# Patient Record
Sex: Male | Born: 1990 | Race: White | Hispanic: No | Marital: Single | Smoking: Never smoker
Health system: Southern US, Community
[De-identification: ages and names within clinical notes are randomized; demographics above are authoritative.]

## PROBLEM LIST (undated history)

## (undated) DIAGNOSIS — F840 Autistic disorder: Secondary | ICD-10-CM

## (undated) DIAGNOSIS — R569 Unspecified convulsions: Secondary | ICD-10-CM

## (undated) HISTORY — DX: Unspecified convulsions: R56.9

## (undated) HISTORY — DX: Autistic disorder: F84.0

---
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
## 2012-01-12 ENCOUNTER — Institutional Professional Consult (permissible substitution): Payer: BC Managed Care – PPO | Admitting: Pediatrics

## 2012-01-28 ENCOUNTER — Institutional Professional Consult (permissible substitution): Payer: BC Managed Care – PPO | Admitting: Pediatrics

## 2012-02-04 ENCOUNTER — Institutional Professional Consult (permissible substitution): Payer: BC Managed Care – PPO | Admitting: Pediatrics

## 2012-02-04 DIAGNOSIS — R279 Unspecified lack of coordination: Secondary | ICD-10-CM

## 2012-02-04 DIAGNOSIS — F909 Attention-deficit hyperactivity disorder, unspecified type: Secondary | ICD-10-CM

## 2012-02-08 ENCOUNTER — Ambulatory Visit: Payer: BC Managed Care – PPO | Attending: Pediatrics | Admitting: Physical Therapy

## 2012-02-08 DIAGNOSIS — IMO0001 Reserved for inherently not codable concepts without codable children: Secondary | ICD-10-CM | POA: Insufficient documentation

## 2012-02-08 DIAGNOSIS — R269 Unspecified abnormalities of gait and mobility: Secondary | ICD-10-CM | POA: Insufficient documentation

## 2012-02-15 ENCOUNTER — Ambulatory Visit: Payer: BC Managed Care – PPO | Admitting: Physical Therapy

## 2012-02-22 ENCOUNTER — Ambulatory Visit: Payer: BC Managed Care – PPO | Admitting: Physical Therapy

## 2012-04-05 ENCOUNTER — Ambulatory Visit: Payer: BC Managed Care – PPO | Attending: Pediatrics | Admitting: Occupational Therapy

## 2012-04-05 DIAGNOSIS — R269 Unspecified abnormalities of gait and mobility: Secondary | ICD-10-CM | POA: Insufficient documentation

## 2012-04-05 DIAGNOSIS — IMO0001 Reserved for inherently not codable concepts without codable children: Secondary | ICD-10-CM | POA: Insufficient documentation

## 2012-04-07 ENCOUNTER — Ambulatory Visit: Payer: BC Managed Care – PPO | Attending: Pediatrics | Admitting: Occupational Therapy

## 2012-04-07 DIAGNOSIS — IMO0001 Reserved for inherently not codable concepts without codable children: Secondary | ICD-10-CM | POA: Insufficient documentation

## 2012-04-07 DIAGNOSIS — R269 Unspecified abnormalities of gait and mobility: Secondary | ICD-10-CM | POA: Insufficient documentation

## 2012-04-20 ENCOUNTER — Ambulatory Visit: Payer: BC Managed Care – PPO | Admitting: Occupational Therapy

## 2012-04-22 ENCOUNTER — Ambulatory Visit: Payer: BC Managed Care – PPO | Admitting: Occupational Therapy

## 2012-04-27 ENCOUNTER — Encounter: Payer: BC Managed Care – PPO | Admitting: Occupational Therapy

## 2012-04-28 ENCOUNTER — Institutional Professional Consult (permissible substitution): Payer: BC Managed Care – PPO | Admitting: Pediatrics

## 2012-04-28 ENCOUNTER — Encounter: Payer: BC Managed Care – PPO | Admitting: Occupational Therapy

## 2012-04-29 ENCOUNTER — Institutional Professional Consult (permissible substitution): Payer: BC Managed Care – PPO | Admitting: Pediatrics

## 2012-04-29 DIAGNOSIS — F909 Attention-deficit hyperactivity disorder, unspecified type: Secondary | ICD-10-CM

## 2012-04-29 DIAGNOSIS — R6250 Unspecified lack of expected normal physiological development in childhood: Secondary | ICD-10-CM

## 2012-08-01 ENCOUNTER — Institutional Professional Consult (permissible substitution): Payer: BC Managed Care – PPO | Admitting: Pediatrics

## 2012-08-01 DIAGNOSIS — F909 Attention-deficit hyperactivity disorder, unspecified type: Secondary | ICD-10-CM

## 2012-08-01 DIAGNOSIS — R6250 Unspecified lack of expected normal physiological development in childhood: Secondary | ICD-10-CM

## 2012-08-07 HISTORY — PX: TENDON TRANSFER ELBOW: SHX2489

## 2012-11-03 ENCOUNTER — Institutional Professional Consult (permissible substitution): Payer: BC Managed Care – PPO | Admitting: Pediatrics

## 2012-11-03 DIAGNOSIS — G809 Cerebral palsy, unspecified: Secondary | ICD-10-CM

## 2012-11-03 DIAGNOSIS — R6250 Unspecified lack of expected normal physiological development in childhood: Secondary | ICD-10-CM

## 2012-11-03 DIAGNOSIS — F909 Attention-deficit hyperactivity disorder, unspecified type: Secondary | ICD-10-CM

## 2013-01-12 ENCOUNTER — Other Ambulatory Visit: Payer: Self-pay

## 2013-01-12 DIAGNOSIS — G40109 Localization-related (focal) (partial) symptomatic epilepsy and epileptic syndromes with simple partial seizures, not intractable, without status epilepticus: Secondary | ICD-10-CM

## 2013-01-12 MED ORDER — DIVALPROEX SODIUM 500 MG PO DR TAB
DELAYED_RELEASE_TABLET | ORAL | Status: DC
Start: 2013-01-12 — End: 2013-02-24

## 2013-01-31 ENCOUNTER — Other Ambulatory Visit: Payer: Self-pay

## 2013-01-31 DIAGNOSIS — G40109 Localization-related (focal) (partial) symptomatic epilepsy and epileptic syndromes with simple partial seizures, not intractable, without status epilepticus: Secondary | ICD-10-CM

## 2013-01-31 MED ORDER — TEGRETOL-XR 100 MG PO TB12: ORAL_TABLET | ORAL | Status: DC

## 2013-02-14 ENCOUNTER — Institutional Professional Consult (permissible substitution): Payer: BC Managed Care – PPO | Admitting: Pediatrics

## 2013-02-14 DIAGNOSIS — F909 Attention-deficit hyperactivity disorder, unspecified type: Secondary | ICD-10-CM

## 2013-02-14 DIAGNOSIS — R279 Unspecified lack of coordination: Secondary | ICD-10-CM

## 2013-02-24 ENCOUNTER — Other Ambulatory Visit: Payer: Self-pay

## 2013-02-24 DIAGNOSIS — G40109 Localization-related (focal) (partial) symptomatic epilepsy and epileptic syndromes with simple partial seizures, not intractable, without status epilepticus: Secondary | ICD-10-CM

## 2013-02-24 MED ORDER — DIVALPROEX SODIUM 500 MG PO DR TAB: DELAYED_RELEASE_TABLET | ORAL | Status: DC

## 2013-02-27 ENCOUNTER — Encounter: Payer: Self-pay | Admitting: Family

## 2013-02-27 DIAGNOSIS — G808 Other cerebral palsy: Secondary | ICD-10-CM

## 2013-02-27 DIAGNOSIS — G8110 Spastic hemiplegia affecting unspecified side: Secondary | ICD-10-CM | POA: Insufficient documentation

## 2013-02-27 DIAGNOSIS — R209 Unspecified disturbances of skin sensation: Secondary | ICD-10-CM

## 2013-02-27 DIAGNOSIS — R269 Unspecified abnormalities of gait and mobility: Secondary | ICD-10-CM | POA: Insufficient documentation

## 2013-02-27 DIAGNOSIS — Z79899 Other long term (current) drug therapy: Secondary | ICD-10-CM

## 2013-02-27 DIAGNOSIS — G40119 Localization-related (focal) (partial) symptomatic epilepsy and epileptic syndromes with simple partial seizures, intractable, without status epilepticus: Secondary | ICD-10-CM

## 2013-02-27 DIAGNOSIS — F909 Attention-deficit hyperactivity disorder, unspecified type: Secondary | ICD-10-CM

## 2013-02-27 DIAGNOSIS — G40409 Other generalized epilepsy and epileptic syndromes, not intractable, without status epilepticus: Secondary | ICD-10-CM | POA: Insufficient documentation

## 2013-02-27 DIAGNOSIS — G40109 Localization-related (focal) (partial) symptomatic epilepsy and epileptic syndromes with simple partial seizures, not intractable, without status epilepticus: Secondary | ICD-10-CM

## 2013-02-27 DIAGNOSIS — R482 Apraxia: Secondary | ICD-10-CM

## 2013-02-28 ENCOUNTER — Ambulatory Visit (INDEPENDENT_AMBULATORY_CARE_PROVIDER_SITE_OTHER): Payer: BC Managed Care – PPO | Admitting: Family

## 2013-02-28 ENCOUNTER — Encounter: Payer: Self-pay | Admitting: Family

## 2013-02-28 VITALS — BP 122/80 | HR 84 | Ht 67.0 in | Wt 155.8 lb

## 2013-02-28 DIAGNOSIS — G40119 Localization-related (focal) (partial) symptomatic epilepsy and epileptic syndromes with simple partial seizures, intractable, without status epilepticus: Secondary | ICD-10-CM

## 2013-02-28 DIAGNOSIS — Z79899 Other long term (current) drug therapy: Secondary | ICD-10-CM

## 2013-02-28 DIAGNOSIS — G808 Other cerebral palsy: Secondary | ICD-10-CM

## 2013-02-28 DIAGNOSIS — G40109 Localization-related (focal) (partial) symptomatic epilepsy and epileptic syndromes with simple partial seizures, not intractable, without status epilepticus: Secondary | ICD-10-CM

## 2013-02-28 DIAGNOSIS — R488 Other symbolic dysfunctions: Secondary | ICD-10-CM

## 2013-02-28 DIAGNOSIS — R482 Apraxia: Secondary | ICD-10-CM

## 2013-02-28 DIAGNOSIS — G8110 Spastic hemiplegia affecting unspecified side: Secondary | ICD-10-CM

## 2013-02-28 DIAGNOSIS — F909 Attention-deficit hyperactivity disorder, unspecified type: Secondary | ICD-10-CM

## 2013-02-28 DIAGNOSIS — R269 Unspecified abnormalities of gait and mobility: Secondary | ICD-10-CM

## 2013-02-28 MED ORDER — TEGRETOL-XR 100 MG PO TB12: ORAL_TABLET | ORAL | Status: DC
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
## 2013-02-28 NOTE — Patient Instructions (Signed)
Continue taking your seizure medications without change.  I have given you a lab order to check his blood count and liver. You can have this done at your convenience this summer.  Let me know if Trevor Lee has any seizures or if you have other concerns.  Plan to return for follow up in 1 year or sooner if needed.

## 2013-03-02 ENCOUNTER — Encounter: Payer: Self-pay | Admitting: Family

## 2013-03-24 ENCOUNTER — Other Ambulatory Visit: Payer: Self-pay

## 2013-03-24 DIAGNOSIS — G40109 Localization-related (focal) (partial) symptomatic epilepsy and epileptic syndromes with simple partial seizures, not intractable, without status epilepticus: Secondary | ICD-10-CM

## 2013-03-24 MED ORDER — DIVALPROEX SODIUM 500 MG PO DR TAB: DELAYED_RELEASE_TABLET | ORAL | Status: DC

## 2013-04-06 ENCOUNTER — Other Ambulatory Visit: Payer: Self-pay | Admitting: Family

## 2013-04-06 LAB — CBC WITH DIFFERENTIAL/PLATELET
Basophils Relative: 0 % (ref 0–1)
Eosinophils Absolute: 0.4 10*3/uL (ref 0.0–0.7)
Hemoglobin: 15.7 g/dL (ref 13.0–17.0)
MCH: 32.3 pg (ref 26.0–34.0)
MCHC: 34.5 g/dL (ref 30.0–36.0)
Monocytes Absolute: 0.7 10*3/uL (ref 0.1–1.0)
Monocytes Relative: 10 % (ref 3–12)
Neutrophils Relative %: 33 % — ABNORMAL LOW (ref 43–77)
RDW: 12.8 % (ref 11.5–15.5)

## 2013-04-07 LAB — ALT: ALT: 12 U/L (ref 0–53)

## 2013-04-11 ENCOUNTER — Telehealth: Payer: Self-pay | Admitting: Pediatrics

## 2013-04-11 NOTE — Telephone Encounter (Signed)
Laboratory work on [employer] from April 06 2013.  White blood cell count 7500, hemoglobin 15.7, hematocrit 45.4, MCV 93.6, platelet count 265,000, absolute granulocytes 2500, ALT 12.  His laboratories are normal.  Please call the family.

## 2013-04-11 NOTE — Telephone Encounter (Signed)
I called results to Mom, Trevor Lee. TG

## 2013-05-17 ENCOUNTER — Institutional Professional Consult (permissible substitution): Payer: BC Managed Care – PPO | Admitting: Pediatrics

## 2013-05-17 DIAGNOSIS — F909 Attention-deficit hyperactivity disorder, unspecified type: Secondary | ICD-10-CM

## 2013-05-17 DIAGNOSIS — R279 Unspecified lack of coordination: Secondary | ICD-10-CM

## 2013-07-13 ENCOUNTER — Telehealth: Payer: Self-pay

## 2013-07-13 NOTE — Telephone Encounter (Signed)
I left a message for Mom asking her to call me back. TG 

## 2013-07-13 NOTE — Telephone Encounter (Signed)
Tresa Endo, mother, lvm asking for a call back from [place], [place] said that pt was released from his pediatrician. She is trying to find a Neurologist that also practices internal medicine so that the doctor could manage pt's meds as well as routine care. Please call mom to discuss [phone].

## 2013-07-14 NOTE — Telephone Encounter (Signed)
The number listed in the phone message below is incorrect. I called Mom back at the number listed in demographics and explained that she would need to find a PCP for Trevor Lee, that a neurologist would not take care of his primary care needs. Mom said that she understood. TG

## 2013-08-08 ENCOUNTER — Institutional Professional Consult (permissible substitution): Payer: BC Managed Care – PPO | Admitting: Pediatrics

## 2013-08-08 DIAGNOSIS — F909 Attention-deficit hyperactivity disorder, unspecified type: Secondary | ICD-10-CM

## 2013-08-08 DIAGNOSIS — R279 Unspecified lack of coordination: Secondary | ICD-10-CM

## 2013-08-30 ENCOUNTER — Other Ambulatory Visit: Payer: Self-pay | Admitting: Family

## 2013-08-30 DIAGNOSIS — G40109 Localization-related (focal) (partial) symptomatic epilepsy and epileptic syndromes with simple partial seizures, not intractable, without status epilepticus: Secondary | ICD-10-CM

## 2013-09-04 ENCOUNTER — Other Ambulatory Visit: Payer: Self-pay | Admitting: Family

## 2013-09-19 ENCOUNTER — Other Ambulatory Visit: Payer: Self-pay | Admitting: Family

## 2013-10-13 ENCOUNTER — Telehealth: Payer: Self-pay | Admitting: Family Medicine

## 2013-10-13 ENCOUNTER — Encounter: Payer: Self-pay | Admitting: General Practice

## 2013-10-13 ENCOUNTER — Encounter (INDEPENDENT_AMBULATORY_CARE_PROVIDER_SITE_OTHER): Payer: Self-pay

## 2013-10-13 ENCOUNTER — Ambulatory Visit (INDEPENDENT_AMBULATORY_CARE_PROVIDER_SITE_OTHER): Payer: BC Managed Care – PPO

## 2013-10-13 ENCOUNTER — Ambulatory Visit (INDEPENDENT_AMBULATORY_CARE_PROVIDER_SITE_OTHER): Payer: BC Managed Care – PPO | Admitting: General Practice

## 2013-10-13 VITALS — BP 133/77 | HR 83 | Temp 97.7°F | Ht 67.0 in | Wt 154.0 lb

## 2013-10-13 DIAGNOSIS — R109 Unspecified abdominal pain: Secondary | ICD-10-CM

## 2013-10-13 DIAGNOSIS — R197 Diarrhea, unspecified: Secondary | ICD-10-CM
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
## 2013-10-13 NOTE — Telephone Encounter (Signed)
appt given for today per mothers request

## 2013-10-13 NOTE — Patient Instructions (Signed)

## 2013-10-16 NOTE — Addendum Note (Signed)
Addended by: Earlene Plater on: 10/16/2013 04:49 PM   Modules accepted: Orders

## 2013-10-16 NOTE — Addendum Note (Signed)
Addended by: Earlene Plater on: 10/16/2013 04:48 PM   Modules accepted: Orders

## 2013-10-16 NOTE — Addendum Note (Signed)
Addended by: Earlene Plater on: 10/16/2013 04:47 PM   Modules accepted: Orders

## 2013-10-17 ENCOUNTER — Telehealth: Payer: Self-pay | Admitting: General Practice

## 2013-10-17 LAB — CLOSTRIDIUM DIFFICILE BY PCR: Toxigenic C. Difficile by PCR: NEGATIVE

## 2013-10-17 NOTE — Telephone Encounter (Signed)
Patient mother aware and will continue to monitor his diarrhea and i advised her that we will call her as soon as we get the results back

## 2013-10-18 LAB — OVA AND PARASITE EXAMINATION

## 2013-10-20 LAB — STOOL CULTURE: E COLI SHIGA TOXIN ASSAY: NEGATIVE

## 2013-11-06 ENCOUNTER — Ambulatory Visit (INDEPENDENT_AMBULATORY_CARE_PROVIDER_SITE_OTHER): Payer: BC Managed Care – PPO | Admitting: Family Medicine

## 2013-11-06 VITALS — BP 124/69 | HR 86 | Temp 97.0°F | Ht 68.0 in | Wt 158.4 lb

## 2013-11-06 DIAGNOSIS — Z0000 Encounter for general adult medical examination without abnormal findings: Secondary | ICD-10-CM

## 2013-11-06 NOTE — Progress Notes (Signed)
   Subjective:    Patient ID: Trevor Lee, male    DOB: [birth year], 23 y.o.   MRN: [id number]  HPI This 23 y.o. male presents for evaluation of establishment visit.  He sees neurology dr Darrel [place].  He has hx of epilepsy and spastic hemiplegia.  He has seen his pediatric Neurologist in September.  The mother states she was told by the neurologist that his pcp  Can write for his anti-seizure medicine.   Review of Systems    No chest pain, SOB, HA, dizziness, vision change, N/V, diarrhea, constipation, dysuria, urinary urgency or frequency, myalgias, arthralgias or rash.  Objective:   Physical Exam Vital signs noted  Well developed well nourished male.  HEENT - Head atraumatic Normocephalic                Eyes - PERRLA, Conjuctiva - clear Sclera- Clear EOMI                Ears - EAC's Wnl TM's Wnl Gross Hearing WNL                Nose - Nares patent                 Throat - oropharanx wnl Respiratory - Lungs CTA bilateral Cardiac - RRR S1 and S2 without murmur GI - Abdomen soft Nontender and bowel sounds active x 4 Extremities - No edema. Neuro - Grossly intact.       Assessment & Plan:  Routine general medical examination at a health care facility Discussed he get CPE labs later in the year and discussed doing Self testicular exam.  Lysbeth Penner FNP

## 2013-11-07 ENCOUNTER — Institutional Professional Consult (permissible substitution): Payer: BC Managed Care – PPO | Admitting: Pediatrics

## 2013-11-07 DIAGNOSIS — F909 Attention-deficit hyperactivity disorder, unspecified type: Secondary | ICD-10-CM

## 2013-11-07 DIAGNOSIS — R279 Unspecified lack of coordination: Secondary | ICD-10-CM

## 2014-02-08 ENCOUNTER — Encounter: Payer: 59 | Admitting: Pediatrics

## 2014-02-08 DIAGNOSIS — F909 Attention-deficit hyperactivity disorder, unspecified type: Secondary | ICD-10-CM

## 2014-02-08 DIAGNOSIS — R279 Unspecified lack of coordination: Secondary | ICD-10-CM

## 2014-02-18 ENCOUNTER — Other Ambulatory Visit: Payer: Self-pay | Admitting: Family

## 2014-03-06 ENCOUNTER — Other Ambulatory Visit: Payer: Self-pay | Admitting: Neurology

## 2014-03-06 DIAGNOSIS — G40109 Localization-related (focal) (partial) symptomatic epilepsy and epileptic syndromes with simple partial seizures, not intractable, without status epilepticus: Secondary | ICD-10-CM

## 2014-03-06 DIAGNOSIS — G40119 Localization-related (focal) (partial) symptomatic epilepsy and epileptic syndromes with simple partial seizures, intractable, without status epilepticus: Secondary | ICD-10-CM

## 2014-03-07 ENCOUNTER — Encounter: Payer: Self-pay | Admitting: Family

## 2014-03-07 ENCOUNTER — Ambulatory Visit (INDEPENDENT_AMBULATORY_CARE_PROVIDER_SITE_OTHER): Payer: 59 | Admitting: Family

## 2014-03-07 VITALS — BP 120/74 | HR 80 | Ht 67.0 in | Wt 162.6 lb

## 2014-03-07 DIAGNOSIS — R488 Other symbolic dysfunctions: Secondary | ICD-10-CM

## 2014-03-07 DIAGNOSIS — R269 Unspecified abnormalities of gait and mobility: Secondary | ICD-10-CM

## 2014-03-07 DIAGNOSIS — G40119 Localization-related (focal) (partial) symptomatic epilepsy and epileptic syndromes with simple partial seizures, intractable, without status epilepticus: Secondary | ICD-10-CM

## 2014-03-07 DIAGNOSIS — Z79899 Other long term (current) drug therapy: Secondary | ICD-10-CM

## 2014-03-07 DIAGNOSIS — G40109 Localization-related (focal) (partial) symptomatic epilepsy and epileptic syndromes with simple partial seizures, not intractable, without status epilepticus: Secondary | ICD-10-CM

## 2014-03-07 DIAGNOSIS — F909 Attention-deficit hyperactivity disorder, unspecified type: Secondary | ICD-10-CM

## 2014-03-07 DIAGNOSIS — R482 Apraxia: Secondary | ICD-10-CM

## 2014-03-07 DIAGNOSIS — G808 Other cerebral palsy: Secondary | ICD-10-CM

## 2014-03-07 DIAGNOSIS — G8110 Spastic hemiplegia affecting unspecified side: Secondary | ICD-10-CM

## 2014-03-07 MED ORDER — DIVALPROEX SODIUM 500 MG PO DR TAB: DELAYED_RELEASE_TABLET | ORAL | Status: DC

## 2014-03-07 MED ORDER — TEGRETOL-XR 100 MG PO TB12: ORAL_TABLET | ORAL | Status: DC

## 2014-03-07 NOTE — Progress Notes (Signed)
Patient: Trevor Lee MRN: [id number] Sex: male DOB: [birth year]  Provider: Rockwell Germany, NP Location of Care: [facility]  Note type: Routine return visit  History of Present Illness: History from: patient's mother Chief Complaint: Seizures  Trevor Lee is a 23 y.o. 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 He was last seen February 28, 2013 and has had no seizures since last seen. Trevor Lee graduated from high school recently with a certificate and has started a day program. Mom said that he has enjoyed the activities and companionship there.  Trevor Lee enjoys music and going to concerts. Mom says that he can play a guitar. Trevor Lee is quite proud today of obtaining an ID card with his photo on it. He has been healthy since last seen. Mom has no concerns today.  Review of Systems: 12 system review was unremarkable  Past Medical History  Diagnosis Date  . Seizures    Hospitalizations: No., Head Injury: No., Nervous System Infections: No., Immunizations up to date: Yes.   Past Medical History Comments: see Hx.  Surgical History Past Surgical History  Procedure Laterality Date  . Tendon transfer elbow Left December 2013    Family History family history is not on file. Family History is otherwise negative for migraines, seizures, cognitive impairment, blindness, deafness, birth defects, chromosomal disorder, autism.  Social History History   Social History  . Marital Status: Single    Spouse Name: N/A    Number of Children: N/A  . Years of Education: N/A   Social History Main Topics  . Smoking status: Never Smoker   . Smokeless tobacco: Never Used  . Alcohol Use: No  . Drug Use: No  . Sexual Activity: No   Other Topics Concern  . None   Social History Narrative    He is a Ship broker at [employer].  He is in a self-contained certificate class model.  He has had difficulty with attention span, and difficulty keeping up with the pace of regular classes.    Educational level: 12th grade [employer] with:  both parents  Hobbies/Interest: Music and playing guitar School comments:  Yaziel is currently attending [employer].  Physical Exam BP 120/74  Pulse 80  Ht 5\' 7"  (1.702 m)  Wt 162 lb 9.6 oz (73.755 kg)  BMI 25.46 kg/m2 General: alert, well nourished, well hydrated young man, no acute distress, brown hair, brown eyes, left-handed  Head: normocephalic, no dysmorphic features  Ears, Nose and Throat: no signs of infection in tympanic membranes, or the oropharynx  Neck: supple neck, full range of motion, no cranial or cervical bruits  Respiratory: lungs clear to auscultation  Cardiovascular: normal pulses, no murmurs  Musculoskeletal: spastic contractures of the left arm and leg with left wrist drop, dystonic 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 He has a tight heel cord on the right, and spasticity with decreased range of motion at the elbow clawhand deformity on the left. He is in an ankle-foot orthosis   Neurologic Exam  Mental Status: oriented to name.  He is able to name objects, follow commands fairly well. He can be somewhat silly at times.  Cranial Nerves: round  reactive pupils, normal fundi, full visual fields to double simultaneous stimuli, symmetric facial strength, midline tongue and uvula. He turns localized sound.  Motor: Spastic left hemiparesis with normal strength and good fine motor movements on the right and mild to moderate weakness on the left constrained by spasticity and significant fine motor incoordination   Sensory: withdrawal x4 to noxious stimuli  Coordination: normal coordination on the right, poor coordination on the left  Gait and Station: left hemiparetic, but he swings his arms and his left foot well because of his brace. At times he actually points left foot straighter than the right one because it is braced. Left arm is semiflexed and slightly adducted as he walks.  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 He will continue his medications without change for now and will return in 1 year or sooner if needed. Mom agreed with these plans.

## 2014-03-08 ENCOUNTER — Encounter: Payer: Self-pay | Admitting: Family

## 2014-03-08 NOTE — Patient Instructions (Signed)
Continue [name]'s medications without change. Let me know if he has any seizures.  Please plan to return for follow up in 1 year or sooner if needed.

## 2014-03-19 ENCOUNTER — Other Ambulatory Visit: Payer: Self-pay | Admitting: Family

## 2014-04-05 ENCOUNTER — Other Ambulatory Visit: Payer: Self-pay | Admitting: Pediatrics

## 2014-04-16 ENCOUNTER — Other Ambulatory Visit: Payer: Self-pay | Admitting: Family

## 2014-04-16 DIAGNOSIS — G40109 Localization-related (focal) (partial) symptomatic epilepsy and epileptic syndromes with simple partial seizures, not intractable, without status epilepticus: Secondary | ICD-10-CM

## 2014-04-16 DIAGNOSIS — G40119 Localization-related (focal) (partial) symptomatic epilepsy and epileptic syndromes with simple partial seizures, intractable, without status epilepticus: Secondary | ICD-10-CM

## 2014-04-16 MED ORDER — DIVALPROEX SODIUM 500 MG PO DR TAB: DELAYED_RELEASE_TABLET | ORAL | Status: DC

## 2014-04-22 ENCOUNTER — Other Ambulatory Visit: Payer: Self-pay | Admitting: Neurology

## 2014-04-23 ENCOUNTER — Telehealth: Payer: Self-pay | Admitting: *Deleted

## 2014-04-23 MED ORDER — TEGRETOL-XR 100 MG PO TB12: ORAL_TABLET | ORAL | Status: DC

## 2014-04-23 NOTE — Telephone Encounter (Signed)
Doreen of [facility] is asking for a prescription for Tegretol. She stated that in order for the pharmacy to cover this, you need to write a RX for a 90 days. She can be reached at [phone]. You can fax the Rx to [phone].

## 2014-04-23 NOTE — Telephone Encounter (Signed)
90 day Rx sent to pharmacy. TG

## 2014-05-29 ENCOUNTER — Institutional Professional Consult (permissible substitution): Payer: 59 | Admitting: Pediatrics

## 2014-05-29 DIAGNOSIS — F909 Attention-deficit hyperactivity disorder, unspecified type: Secondary | ICD-10-CM

## 2014-05-29 DIAGNOSIS — R279 Unspecified lack of coordination: Secondary | ICD-10-CM

## 2014-08-28 ENCOUNTER — Institutional Professional Consult (permissible substitution): Payer: 59 | Admitting: Pediatrics

## 2014-08-28 DIAGNOSIS — F902 Attention-deficit hyperactivity disorder, combined type: Secondary | ICD-10-CM

## 2014-08-28 DIAGNOSIS — F8181 Disorder of written expression: Secondary | ICD-10-CM

## 2014-10-05 ENCOUNTER — Other Ambulatory Visit: Payer: Self-pay | Admitting: Family

## 2014-10-11 ENCOUNTER — Ambulatory Visit: Payer: 59 | Attending: Audiology | Admitting: Audiology

## 2014-10-11 DIAGNOSIS — Z0110 Encounter for examination of ears and hearing without abnormal findings: Secondary | ICD-10-CM | POA: Diagnosis present

## 2014-10-11 DIAGNOSIS — H93293 Other abnormal auditory perceptions, bilateral: Secondary | ICD-10-CM

## 2014-10-11 DIAGNOSIS — Z01118 Encounter for examination of ears and hearing with other abnormal findings: Secondary | ICD-10-CM | POA: Diagnosis not present

## 2014-10-11 DIAGNOSIS — Z00129 Encounter for routine child health examination without abnormal findings: Secondary | ICD-10-CM
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
## 2014-11-27 ENCOUNTER — Institutional Professional Consult (permissible substitution) (INDEPENDENT_AMBULATORY_CARE_PROVIDER_SITE_OTHER): Payer: 59 | Admitting: Pediatrics

## 2014-11-27 DIAGNOSIS — F8181 Disorder of written expression: Secondary | ICD-10-CM | POA: Diagnosis not present

## 2014-11-27 DIAGNOSIS — F902 Attention-deficit hyperactivity disorder, combined type: Secondary | ICD-10-CM | POA: Diagnosis not present

## 2014-11-28 ENCOUNTER — Ambulatory Visit (INDEPENDENT_AMBULATORY_CARE_PROVIDER_SITE_OTHER): Payer: 59 | Admitting: Family Medicine

## 2014-11-28 ENCOUNTER — Encounter: Payer: Self-pay | Admitting: Family Medicine

## 2014-11-28 VITALS — BP 146/84 | HR 98 | Temp 97.3°F | Ht 67.0 in | Wt 177.4 lb

## 2014-11-28 DIAGNOSIS — L723 Sebaceous cyst: Secondary | ICD-10-CM

## 2014-11-28 NOTE — Progress Notes (Signed)
   Subjective:  Patient ID: Trevor Lee, male    DOB: 1991-05-13  Age: 24 y.o. MRN: [id number]  CC: Cyst   HPI TECUMSEH YEAGLEY presents for this cyst is on the back of his scalp at the right occiput just behind the mastoid region. It is nontender. It has gotten somewhat bigger. There are none others noted. This is not affecting his hearing. It has not been painful. There has been no earache. No recent infection. It has been present for several months but recently increased in size.  History Albin has a past medical history of Seizures.   He has past surgical history that includes Tendon transfer elbow (Left, December 2013).   His family history is not on file.He reports that he has never smoked. He has never used smokeless tobacco. He reports that he does not drink alcohol or use illicit drugs.  Current Outpatient Prescriptions on File Prior to Visit  Medication Sig Dispense Refill  . divalproex (DEPAKOTE) 500 MG DR tablet TAKE 1 TABLET BY MOUTH IN THE MORNING, TAKE 1 TABLET AT MID-DAY, AND 2 TABLETS AT BEDTIME 360 tablet 3  . INTUNIV 4 MG TB24     . TEGRETOL-XR 100 MG 12 hr tablet TAKE 1 TABLET BY MOUTH AM, THEN 1 TAB AT NOON AND 2 AT BEDTIME 372 tablet 1  . VYVANSE 60 MG capsule Takes 50mg  per MOM     No current facility-administered medications on file prior to visit.    ROS Review of Systems  Constitutional: Negative for fever, chills, activity change and appetite change.  HENT: Negative for congestion, ear pain, facial swelling and sinus pressure.   Eyes: Negative for discharge.  Skin: Negative for color change, rash and wound.  Neurological: Negative for headaches.    Objective:  BP 146/84 mmHg  Pulse 98  Temp(Src) 97.3 F (36.3 C) (Oral)  Ht 5\' 7"  (1.702 m)  Wt 177 lb 6.4 oz (80.468 kg)  BMI 27.78 kg/m2  BP Readings from Last 3 Encounters:  12/03/14 141/89  11/28/14 146/84  03/07/14 120/74    Wt Readings from Last 3 Encounters:  12/03/14 175 lb  (79.379 kg)  11/28/14 177 lb 6.4 oz (80.468 kg)  03/07/14 162 lb 9.6 oz (73.755 kg)     Physical Exam  Constitutional: He appears well-developed. No distress.  Neck: Neck supple.  Lymphadenopathy:    He has no cervical adenopathy.  Skin: Skin is warm and dry.  The above-noted lesion is freely movable under the skin. It is approximately 2 x 4 mm in size located at the previously mentioned area. It is nontender. It is not fluctuant.    No results found for: HGBA1C  Lab Results  Component Value Date   WBC 5.9 12/03/2014   HGB 16.5 12/03/2014   HCT 53.0 12/03/2014   PLT 265 04/06/2013   ALT 12 04/06/2013    No results found.  Assessment & Plan:   [name] was seen today for cyst.  Diagnoses and all orders for this visit:  Sebaceous cyst   I have discontinued Mr. Timmons PREVIDENT 5000 BOOSTER PLUS. I am also having him maintain his INTUNIV, VYVANSE, divalproex, and TEGRETOL-XR.  No orders of the defined types were placed in this encounter.     Follow-up:  cyst removal to be scheduled with Marline Backbone. Complete physical exam at patient's convenience to be scheduled with me.  Claretta Fraise, M.D.

## 2014-12-03 ENCOUNTER — Ambulatory Visit (INDEPENDENT_AMBULATORY_CARE_PROVIDER_SITE_OTHER): Payer: 59 | Admitting: Physician Assistant

## 2014-12-03 ENCOUNTER — Other Ambulatory Visit (INDEPENDENT_AMBULATORY_CARE_PROVIDER_SITE_OTHER): Payer: 59

## 2014-12-03 ENCOUNTER — Encounter: Payer: Self-pay | Admitting: Physician Assistant

## 2014-12-03 ENCOUNTER — Other Ambulatory Visit: Payer: Self-pay | Admitting: *Deleted

## 2014-12-03 VITALS — BP 141/89 | HR 92 | Temp 97.5°F | Ht 67.0 in | Wt 175.0 lb

## 2014-12-03 DIAGNOSIS — L7211 Pilar cyst: Secondary | ICD-10-CM | POA: Diagnosis not present

## 2014-12-03 DIAGNOSIS — Z79899 Other long term (current) drug therapy: Secondary | ICD-10-CM | POA: Diagnosis not present

## 2014-12-03 DIAGNOSIS — Z0000 Encounter for general adult medical examination without abnormal findings: Secondary | ICD-10-CM

## 2014-12-03 LAB — POCT CBC
GRANULOCYTE PERCENT: 40.7 % (ref 37–80)
HCT, POC: 53 % (ref 43.5–53.7)
HEMOGLOBIN: 16.5 g/dL (ref 14.1–18.1)
LYMPH, POC: 3.3 (ref 0.6–3.4)
MCH: 30.54 pg (ref 27–31.2)
MCHC: 31.1 g/dL — AB (ref 31.8–35.4)
MCV: 98.1 fL — AB (ref 80–97)
MPV: 10.1 fL (ref 0–99.8)
POC Granulocyte: 2.4 (ref 2–6.9)
POC LYMPH %: 56.6 % — AB (ref 10–50)
Platelet Count, POC: 256 10*3/uL (ref 142–424)
RBC: 5.4 M/uL (ref 4.69–6.13)
RDW, POC: 12 %
WBC: 5.9 10*3/uL (ref 4.6–10.2)

## 2014-12-03 NOTE — Progress Notes (Signed)
   Subjective:    Patient ID: Trevor Lee, male    DOB: 12/27/1990, 24 y.o.   MRN: [id number]  HPI 24 y/o male presents for asymptomatic cyst on posterior right scalp x several years, accompanied by his mother. Asymptomatic regarding pain and discharge. Has not noticed a change or increase in size.     Review of Systems  Skin: Positive for color change.       Cyst on back of scalp  Neurological: Negative.        Objective:   Physical Exam  Skin:  Palpable firm, mobile mass on posterior right scalp. No discharge. Nontender to palpation.   Approximately 1cm x 2cm          Assessment & Plan:  1. 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 After explaining that patient would need sutures and the detail of the procedure, mother and patient would like to wait for excision. I have advised them that this type of cyst is almost always benign, however, if any changes occur regarding pain, discharge or increase in size that excision is advisable. They will make a f/u appt if they decide to proceed with excision.

## 2014-12-03 NOTE — Progress Notes (Signed)
Lab only 

## 2014-12-04 LAB — CMP14+EGFR
ALT: 12 IU/L (ref 0–44)
AST: 15 IU/L (ref 0–40)
Albumin/Globulin Ratio: 2.4 (ref 1.1–2.5)
Albumin: 4.7 g/dL (ref 3.5–5.5)
Alkaline Phosphatase: 88 IU/L (ref 39–117)
BUN/Creatinine Ratio: 31 — ABNORMAL HIGH (ref 8–19)
BUN: 19 mg/dL (ref 6–20)
Bilirubin Total: 0.3 mg/dL (ref 0.0–1.2)
CO2: 24 mmol/L (ref 18–29)
Calcium: 9.7 mg/dL (ref 8.7–10.2)
Chloride: 98 mmol/L (ref 97–108)
Creatinine, Ser: 0.62 mg/dL — ABNORMAL LOW (ref 0.76–1.27)
GFR calc non Af Amer: 142 mL/min/{1.73_m2} (ref >59)
GFR, EST AFRICAN AMERICAN: 164 mL/min/{1.73_m2} (ref >59)
GLOBULIN, TOTAL: 2 g/dL (ref 1.5–4.5)
Glucose: 90 mg/dL (ref 65–99)
POTASSIUM: 4.3 mmol/L (ref 3.5–5.2)
SODIUM: 138 mmol/L (ref 134–144)
TOTAL PROTEIN: 6.7 g/dL (ref 6.0–8.5)

## 2014-12-04 LAB — LIPID PANEL
CHOLESTEROL TOTAL: 161 mg/dL (ref 100–199)
Chol/HDL Ratio: 2.5 ratio units (ref 0.0–5.0)
HDL: 65 mg/dL (ref >39)
LDL Calculated: 81 mg/dL (ref 0–99)
TRIGLYCERIDES: 77 mg/dL (ref 0–149)
VLDL CHOLESTEROL CAL: 15 mg/dL (ref 5–40)

## 2014-12-04 LAB — VALPROIC ACID LEVEL: Valproic Acid Lvl: 73 ug/mL (ref 50–100)

## 2014-12-04 LAB — CARBAMAZEPINE LEVEL, TOTAL: CARBAMAZEPINE LVL: 4.4 ug/mL (ref 4.0–12.0)

## 2014-12-06 ENCOUNTER — Encounter: Payer: Self-pay | Admitting: *Deleted

## 2014-12-31 ENCOUNTER — Encounter: Payer: 59 | Admitting: Family Medicine

## 2015-01-07 ENCOUNTER — Encounter: Payer: Self-pay | Admitting: Family Medicine

## 2015-01-07 ENCOUNTER — Ambulatory Visit (INDEPENDENT_AMBULATORY_CARE_PROVIDER_SITE_OTHER): Payer: 59 | Admitting: Family Medicine

## 2015-01-07 VITALS — BP 131/84 | HR 100 | Temp 97.1°F | Ht 67.0 in | Wt 175.0 lb

## 2015-01-07 DIAGNOSIS — Z0000 Encounter for general adult medical examination without abnormal findings: Secondary | ICD-10-CM

## 2015-01-07 NOTE — Progress Notes (Signed)
Subjective:  Patient ID: Trevor Lee, male    DOB: [birth year]  Age: 24 y.o. MRN: [id number]  CC: Annual Exam   HPI Trevor Lee presents for complete annual physical examination   History Trevor Lee has a past medical history of Seizures.   He has past surgical history that includes Tendon transfer elbow (Left, December 2013).   His family history is not on file.He reports that he has never smoked. He has never used smokeless tobacco. He reports that he does not drink alcohol or use illicit drugs.  Current Outpatient Prescriptions on File Prior to Visit  Medication Sig Dispense Refill  . divalproex (DEPAKOTE) 500 MG DR tablet TAKE 1 TABLET BY MOUTH IN THE MORNING, TAKE 1 TABLET AT MID-DAY, AND 2 TABLETS AT BEDTIME 360 tablet 3  . INTUNIV 4 MG TB24     . TEGRETOL-XR 100 MG 12 hr tablet TAKE 1 TABLET BY MOUTH AM, THEN 1 TAB AT NOON AND 2 AT BEDTIME 372 tablet 1  . VYVANSE 60 MG capsule Takes 50mg  per MOM     No current facility-administered medications on file prior to visit.    ROS Review of Systems  Constitutional: Negative for fever, chills, diaphoresis, activity change, appetite change, fatigue and unexpected weight change.  HENT: Negative for congestion, ear pain, hearing loss, postnasal drip, rhinorrhea, sore throat, tinnitus and trouble swallowing.   Eyes: Negative for photophobia, pain, discharge and redness.  Respiratory: Negative for apnea, cough, choking, chest tightness, shortness of breath, wheezing and stridor.   Cardiovascular: Negative for chest pain, palpitations and leg swelling.  Gastrointestinal: Negative for nausea, vomiting, abdominal pain, diarrhea, constipation, blood in stool and abdominal distention.  Endocrine: Negative for cold intolerance, heat intolerance, polydipsia, polyphagia and polyuria.  Genitourinary: Negative for dysuria, urgency, frequency, hematuria, flank pain, enuresis, difficulty urinating and genital sores.  Musculoskeletal:  Negative for joint swelling and arthralgias.  Skin: Negative for color change, rash and wound.  Allergic/Immunologic: Negative for immunocompromised state.  Neurological: Positive for seizures (g) and speech difficulty. Negative for dizziness, tremors, syncope, facial asymmetry, weakness, light-headedness, numbness and headaches.  Hematological: Does not bruise/bleed easily.  Psychiatric/Behavioral: Negative for suicidal ideas, hallucinations, behavioral problems, confusion, sleep disturbance, dysphoric mood, decreased concentration and agitation. The patient is not nervous/anxious and is not hyperactive.     Objective:  BP 131/84 mmHg  Pulse 100  Temp(Src) 97.1 F (36.2 C) (Oral)  Ht 5\' 7"  (1.702 m)  Wt 175 lb (79.379 kg)  BMI 27.40 kg/m2  BP Readings from Last 3 Encounters:  01/07/15 131/84  12/03/14 141/89  11/28/14 146/84    Wt Readings from Last 3 Encounters:  01/07/15 175 lb (79.379 kg)  12/03/14 175 lb (79.379 kg)  11/28/14 177 lb 6.4 oz (80.468 kg)     Physical Exam  Constitutional: He is oriented to person, place, and time. He appears well-developed and well-nourished.  HENT:  Head: Normocephalic and atraumatic.  Mouth/Throat: Oropharynx is clear and moist.  Eyes: EOM are normal. Pupils are equal, round, and reactive to light.  Neck: Normal range of motion. No tracheal deviation present. No thyromegaly present.  Cardiovascular: Normal rate, regular rhythm and normal heart sounds.  Exam reveals no gallop and no friction rub.   No murmur heard. Pulmonary/Chest: Breath sounds normal. He has no wheezes. He has no rales.  Abdominal: Soft. He exhibits no mass. There is no tenderness.  Musculoskeletal: Normal range of motion. He exhibits no edema.  Neurological: He is alert and oriented  to person, place, and time.  Skin: Skin is warm and dry.  Psychiatric: He has a normal mood and affect.    No results found for: HGBA1C  Lab Results  Component Value Date   WBC 5.9  12/03/2014   HGB 16.5 12/03/2014   HCT 53.0 12/03/2014   PLT 265 04/06/2013   GLUCOSE 90 12/03/2014   CHOL 161 12/03/2014   TRIG 77 12/03/2014   HDL 65 12/03/2014   LDLCALC 81 12/03/2014   ALT 12 12/03/2014   AST 15 12/03/2014   NA 138 12/03/2014   K 4.3 12/03/2014   CL 98 12/03/2014   CREATININE 0.62* 12/03/2014   BUN 19 12/03/2014   CO2 24 12/03/2014    No results found.  Assessment & Plan:   There are no diagnoses linked to this encounter. I am having Mr. Trevor Lee maintain his INTUNIV, VYVANSE, divalproex, and TEGRETOL-XR.  No orders of the defined types were placed in this encounter.   Patient was counseled on appropriate diet. Low carbohydrate, low sodium, high protein approach.   Regular exercise benefit with mix of cardio and resistance training was reviewed.  Reminded to wear seat belt when driving or a passenger. Handout on monitoring calories for weight given along with reminders to avoid excessive carbohydrates in his diet. Mom has control of his diet fairly well but Trevor Lee tends to not follow her device all the time.  Follow-up: No Follow-up on file.  Claretta Fraise, M.D.

## 2015-01-07 NOTE — Patient Instructions (Signed)

## 2015-01-28 IMAGING — CR DG ABDOMEN 1V
1 series · 1 of 1 positions shown · non-contrast
Comparison: None.

CLINICAL DATA: Lower abdominal pain and diarrhea

EXAM:
ABDOMEN - 1 VIEW

[view not recorded]
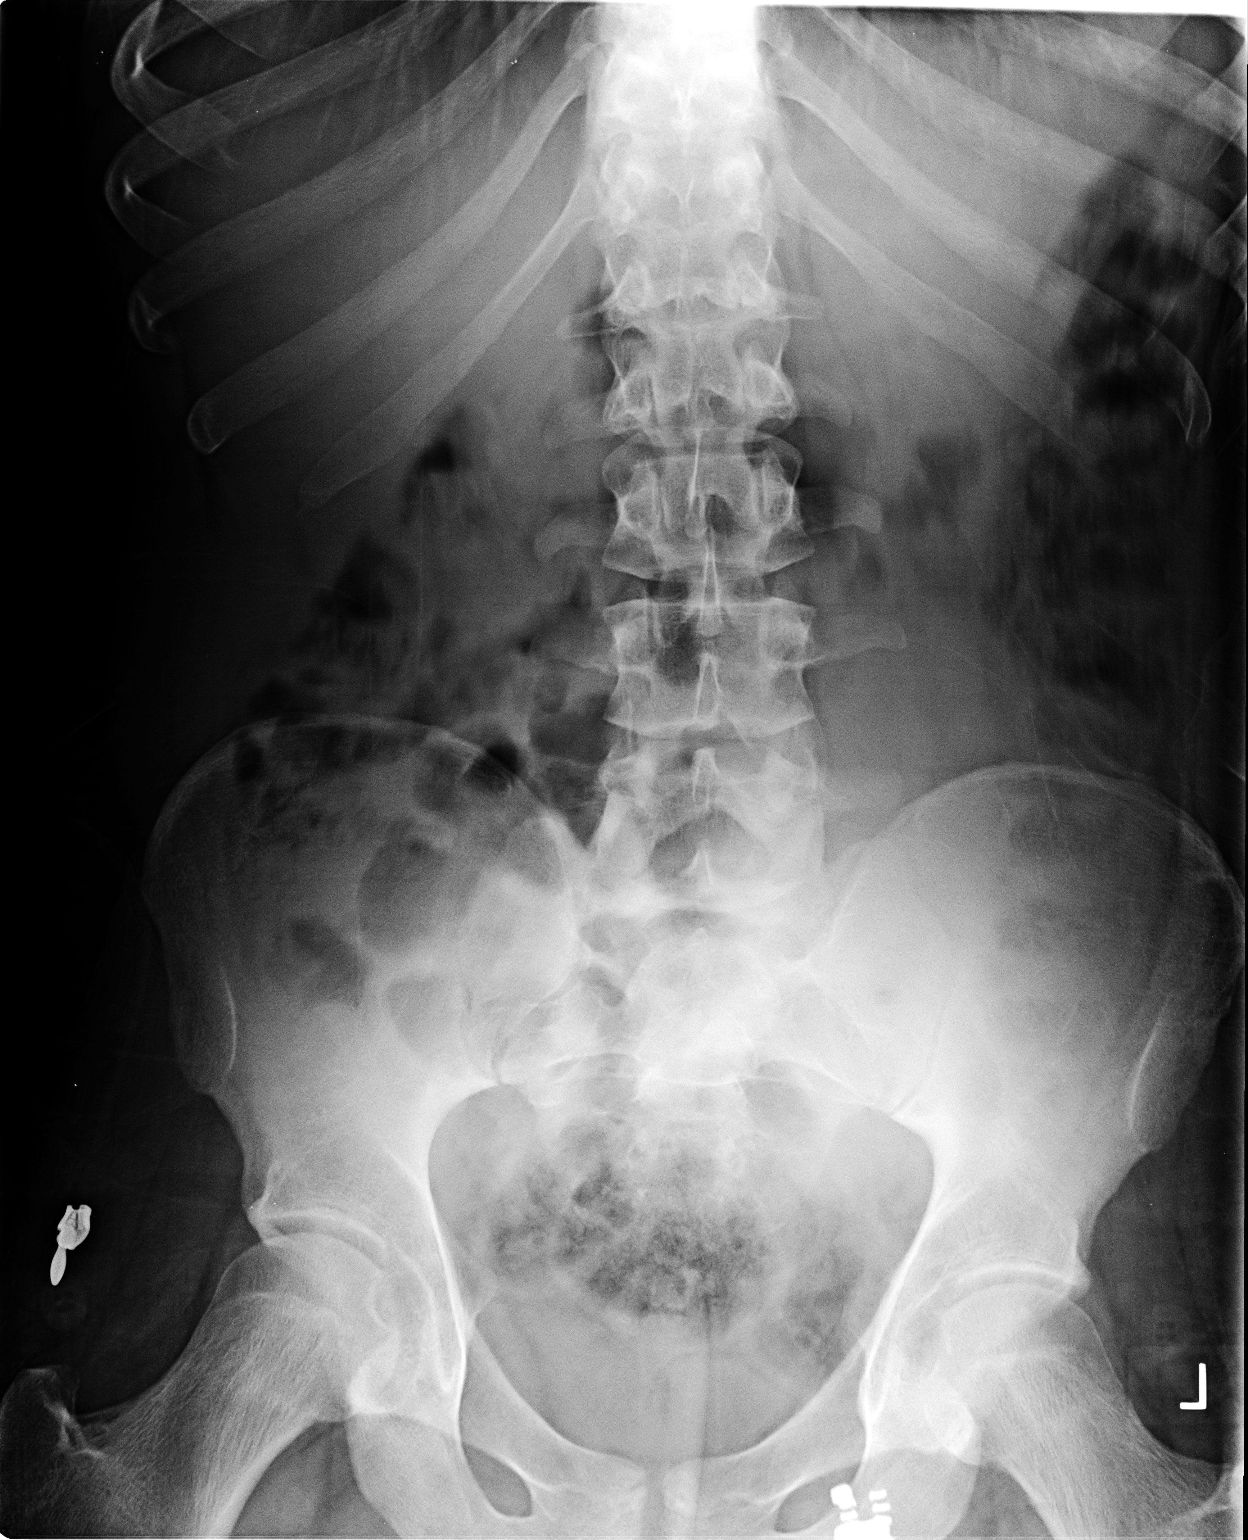

[1 of 1 positions shown; findings below may reference images not displayed]

FINDINGS: There is moderate stool in the colon. The bowel gas pattern is
unremarkable. No obstruction or free air is seen on this supine
examination. There are no abnormal calcifications.
IMPRESSION: Unremarkable bowel gas pattern.  Moderate stool in colon.

## 2015-02-22 ENCOUNTER — Institutional Professional Consult (permissible substitution): Payer: 59 | Admitting: Pediatrics

## 2015-02-22 DIAGNOSIS — G801 Spastic diplegic cerebral palsy: Secondary | ICD-10-CM | POA: Diagnosis not present

## 2015-02-22 DIAGNOSIS — F902 Attention-deficit hyperactivity disorder, combined type: Secondary | ICD-10-CM | POA: Diagnosis not present

## 2015-03-08 ENCOUNTER — Encounter: Payer: Self-pay | Admitting: Family

## 2015-03-08 ENCOUNTER — Ambulatory Visit (INDEPENDENT_AMBULATORY_CARE_PROVIDER_SITE_OTHER): Payer: 59 | Admitting: Family

## 2015-03-08 VITALS — BP 116/70 | HR 84 | Ht 67.0 in | Wt 173.6 lb

## 2015-03-08 DIAGNOSIS — G8110 Spastic hemiplegia affecting unspecified side: Secondary | ICD-10-CM | POA: Diagnosis not present

## 2015-03-08 DIAGNOSIS — R269 Unspecified abnormalities of gait and mobility: Secondary | ICD-10-CM | POA: Diagnosis not present

## 2015-03-08 DIAGNOSIS — F902 Attention-deficit hyperactivity disorder, combined type: Secondary | ICD-10-CM

## 2015-03-08 DIAGNOSIS — G40119 Localization-related (focal) (partial) symptomatic epilepsy and epileptic syndromes with simple partial seizures, intractable, without status epilepticus: Secondary | ICD-10-CM | POA: Diagnosis not present

## 2015-03-08 DIAGNOSIS — G40109 Localization-related (focal) (partial) symptomatic epilepsy and epileptic syndromes with simple partial seizures, not intractable, without status epilepticus: Secondary | ICD-10-CM | POA: Diagnosis not present

## 2015-03-08 DIAGNOSIS — R482 Apraxia: Secondary | ICD-10-CM | POA: Diagnosis not present

## 2015-03-08 DIAGNOSIS — G808 Other cerebral palsy: Secondary | ICD-10-CM | POA: Diagnosis not present

## 2015-03-08 MED ORDER — TEGRETOL-XR 100 MG PO TB12: ORAL_TABLET | ORAL | Status: DC

## 2015-03-08 MED ORDER — DIVALPROEX SODIUM 500 MG PO DR TAB: DELAYED_RELEASE_TABLET | ORAL | Status: DC

## 2015-03-08 NOTE — Patient Instructions (Signed)
Continue [name]'s medication as you have been giving it. Let me know if he has any seizures.   Please plan to return for follow up in 1 year or sooner if needed.

## 2015-03-08 NOTE — Progress Notes (Signed)
Patient: Trevor Lee MRN: [id number] Sex: male DOB: 05/31/1991  Provider: Rockwell Germany, NP Location of Care: [facility]  Note type: Routine return visit  History of Present Illness: History from: mother and patient Chief Complaint: Seizures  Trevor Lee is a 24 y.o. 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 He was last seen March 07, 2014.  Mom reports today that Trevor Lee has had no witnessed seizures since last seen, but that he has told her on occasion that he had a seizure. She said that when he has a seizure that Trevor Lee hears buzzing in his ears prior to the seizure occuring, and that Trevor Lee sometimes reports that he hears the buzzing sound.   Trevor Lee goes to a day program 3 days per week and has been working as part of the day program. 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 Mom said that he has done well, but that his coach is still working with him on consistency in tasks that he is responsible for doing.   Mom said that Trevor Lee has been otherwise healthy since last seen. He has a sebaceous cyst on his scalp that is being monitored by his PCP. Trevor Lee tells me today that his family is going on vacation tomorrow to [facility], and he is looking forward to activities there. Mom has no other complaints or concerns about Trevor Lee's health today.  Review of Systems: Please see the HPI for neurologic and other pertinent review of systems. Otherwise, the following systems are noncontributory including constitutional, eyes, ears, nose and throat, cardiovascular, respiratory, gastrointestinal, genitourinary, musculoskeletal, skin, endocrine, hematologic/lymph, allergic/immunologic and psychiatric.   Past Medical History  Diagnosis  Date  . Seizures    Hospitalizations: No., Head Injury: No., Nervous System Infections: No., Immunizations up to date: Yes.   Past Medical History Comments: See history  Surgical History Past Surgical History  Procedure Laterality Date  . Tendon transfer elbow Left December 2013    Family History family history is not on file. Family History is otherwise negative for migraines, seizures, cognitive impairment, blindness, deafness, birth defects, chromosomal disorder, autism.  Social History History   Social History  . Marital Status: Single    Spouse Name: N/A  . Number of Children: N/A  . Years of Education: N/A   Social History Main Topics  . Smoking status: Never Smoker   . Smokeless tobacco: Never Used  . Alcohol Use: No  . Drug Use: No  . Sexual Activity: No   Other Topics Concern  . Not on file   Social History Narrative   He graduated from [employer] where he was in a self contained class setting. He has had difficulty with attention span, and difficulty keeping up with the pace of regular classes. Trevor Lee is now in enrolled in a day program 5 days per week.   Educational level: school      [place] with:  mother  Hobbies/Interest: Trevor Lee enjoys swimming, riding his bike, playing basketball, listening to music, and playing music.  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   Allergies No Known Allergies  Physical Exam BP 116/70 mmHg  Pulse 84  Ht 5\' 7"  (1.702 m)  Wt  173 lb 9.6 oz (78.744 kg)  BMI 27.18 kg/m2 General: alert, well nourished, well hydrated young man, no acute distress, brown hair, brown eyes, left-handed  Head: normocephalic, no dysmorphic features  Ears, Nose and Throat: no signs of infection in tympanic membranes, or the oropharynx  Neck: supple neck, full range of motion, no cranial or cervical bruits  Respiratory: lungs  clear to auscultation  Cardiovascular: normal pulses, no murmurs  Musculoskeletal: spastic contractures of the left arm and leg with left wrist drop, dystonic 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 He has a tight heel cord on the right, and spasticity with decreased range of motion at the elbow clawhand deformity on the left. He is in an ankle-foot orthosis   Neurologic Exam  Mental Status: oriented to name. He is able to name objects, follow commands fairly well. He can be somewhat silly at times.  Cranial Nerves: round reactive pupils, normal fundi, full visual fields to double simultaneous stimuli, symmetric facial strength, midline tongue and uvula. He turns localized sound.  Motor: Spastic left hemiparesis with normal strength and good fine motor movements on the right and mild to moderate weakness on the left constrained by spasticity and significant fine motor incoordination  Sensory: withdrawal x4 to noxious stimuli  Coordination: normal coordination on the right, poor coordination on the left  Gait and Station: left hemiparetic, but he swings his arms and his left foot well because of his brace. At times he actually points left foot straighter than the right one because it is braced. Left arm is semiflexed and slightly adducted as he walks.  Reflexes: left reflex predominance, left extensor plantar response   Impression 1. Localization related epilepsy 2. Congenital left hemiparesis with left hemiatrophy 3. 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 Severe attention deficit disorder   Recommendations for plan of care The patient's previous [facility] records were reviewed. Trevor Lee has neither had nor required imaging or lab studies since the last visit. 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 His seizures are in good control at this time. He will continue his medications without change for now and will return in 1 year or sooner if needed. Mom agreed with these plans.   The medication list was reviewed and reconciled.  No changes were made in the prescribed medications today.  A complete medication list was provided to the patient's mother.  Total time spent with the patient was 20 minutes, of which 50% or more was spent in counseling and coordination of care.

## 2015-04-01 ENCOUNTER — Telehealth: Payer: Self-pay | Admitting: *Deleted

## 2015-04-01 DIAGNOSIS — G40119 Localization-related (focal) (partial) symptomatic epilepsy and epileptic syndromes with simple partial seizures, intractable, without status epilepticus: Secondary | ICD-10-CM

## 2015-04-01 DIAGNOSIS — G40109 Localization-related (focal) (partial) symptomatic epilepsy and epileptic syndromes with simple partial seizures, not intractable, without status epilepticus: Secondary | ICD-10-CM

## 2015-04-01 MED ORDER — DIVALPROEX SODIUM 500 MG PO DR TAB: DELAYED_RELEASE_TABLET | ORAL | Status: DC

## 2015-04-01 MED ORDER — TEGRETOL-XR 100 MG PO TB12: ORAL_TABLET | ORAL | Status: DC

## 2015-04-01 NOTE — Telephone Encounter (Signed)
Claiborne Billings, patient's mother, called and requested [place] sending copies of prescriptions by [place] to [facility] for an over night trip. She states that they are requesting these look like original prescriptions and to please fax over to the information below:  Attention: Patrica Duel Fax #: [phone]

## 2015-04-02 NOTE — Telephone Encounter (Signed)
Please let Mom know that this has been done. Thanks, Otila Kluver

## 2015-04-02 NOTE — Telephone Encounter (Signed)
Claiborne Billings, patient's mother, advised and she verbalized understanding. Invited her to call me back if anything else was needed.

## 2015-04-05 ENCOUNTER — Other Ambulatory Visit: Payer: Self-pay | Admitting: Family

## 2015-04-07 ENCOUNTER — Other Ambulatory Visit: Payer: Self-pay | Admitting: Family

## 2015-05-21 ENCOUNTER — Institutional Professional Consult (permissible substitution) (INDEPENDENT_AMBULATORY_CARE_PROVIDER_SITE_OTHER): Payer: 59 | Admitting: Pediatrics

## 2015-05-21 DIAGNOSIS — G801 Spastic diplegic cerebral palsy: Secondary | ICD-10-CM | POA: Diagnosis not present

## 2015-05-21 DIAGNOSIS — F902 Attention-deficit hyperactivity disorder, combined type: Secondary | ICD-10-CM | POA: Diagnosis not present

## 2015-05-22 ENCOUNTER — Telehealth: Payer: Self-pay | Admitting: Family

## 2015-05-22 DIAGNOSIS — G40119 Localization-related (focal) (partial) symptomatic epilepsy and epileptic syndromes with simple partial seizures, intractable, without status epilepticus: Secondary | ICD-10-CM

## 2015-05-22 DIAGNOSIS — G40109 Localization-related (focal) (partial) symptomatic epilepsy and epileptic syndromes with simple partial seizures, not intractable, without status epilepticus: Secondary | ICD-10-CM

## 2015-05-22 MED ORDER — TEGRETOL-XR 100 MG PO TB12: ORAL_TABLET | ORAL | Status: DC

## 2015-05-22 NOTE — Telephone Encounter (Signed)
I reviewed your note and agree with the plan. If he has further seizures, we will have to get updated drug levels.

## 2015-05-22 NOTE — Telephone Encounter (Signed)
Mom Onaje Warne left message about Trevor Lee. She said that he had an episode on Saturday while at dinner that she believes was a seizure. Mom said that he was fine, eating meal, talking, then he stopped, his eyes dilated, stared and he lost awareness with his world for 30-45 seconds. Afterwards he told his mother that he felt like his brain was on fire during the episode. Trevor Lee had labs done at [facility] in March and at that time, his Tegretol was 4.4 mcg/ml and Depakote level was 73 mcg/ml. Mom wonders if the Tegretol dose could increase. Mom said that he has been having problems with impulsivity and moodiness. He also just saw Janifer Adie and she suggested that increasing the Tegretol may help versus adding another medication to address mood. I told Mom that I would recommend increasing Tegretol XR to 2 tablets in the morning, 1 at midday and 2 at bedtime. I cautioned her that it would be not likely to help with the impulsivity but may help some with mood. I also cautioned her to observe for side effects with the increased dose and to let me know if that occurred. Mom agreed with these plans. I updated his Rx at the pharmacy. TG

## 2015-07-11 ENCOUNTER — Ambulatory Visit (INDEPENDENT_AMBULATORY_CARE_PROVIDER_SITE_OTHER): Payer: 59 | Admitting: Family Medicine

## 2015-07-11 ENCOUNTER — Encounter: Payer: Self-pay | Admitting: Family Medicine

## 2015-07-11 VITALS — BP 120/76 | HR 84 | Temp 97.9°F | Ht 67.0 in | Wt 173.0 lb

## 2015-07-11 DIAGNOSIS — Z23 Encounter for immunization: Secondary | ICD-10-CM | POA: Diagnosis not present

## 2015-07-11 DIAGNOSIS — G40109 Localization-related (focal) (partial) symptomatic epilepsy and epileptic syndromes with simple partial seizures, not intractable, without status epilepticus: Secondary | ICD-10-CM | POA: Diagnosis not present

## 2015-07-11 DIAGNOSIS — F429 Obsessive-compulsive disorder, unspecified: Secondary | ICD-10-CM | POA: Diagnosis not present

## 2015-07-11 DIAGNOSIS — Z79899 Other long term (current) drug therapy: Secondary | ICD-10-CM | POA: Diagnosis not present

## 2015-07-11 MED ORDER — SERTRALINE HCL 50 MG PO TABS: 50.0000 mg | ORAL_TABLET | Freq: Every day | ORAL | Status: DC

## 2015-07-11 NOTE — Progress Notes (Signed)
Subjective:  Patient ID: Trevor Lee, male    DOB: 1991-06-24  Age: 24 y.o. MRN: [id number]  CC: 6 mth check up   HPI Trevor Lee presents for mom states that J but is having more trouble controlling his emotions and at times becoming hostile and somewhat violent. He is developing symptoms that she relates to OCD. He will be doing an idiosyncratic and or repetitive activity and if she interrupted he will become almost violent with her. She points out an example of him walking back and forth on the porch of their home for hours at a time just pacing back and forth, back and forth. When she tried to check with him to see why he been doing that for so long he erupted into a tantrum-like episode during which she felt threatened.  History Trevor Lee has a past medical history of Seizures ([place]).   He has past surgical history that includes Tendon transfer elbow (Left, December 2013).   His family history is not on file.He reports that he has never smoked. He has never used smokeless tobacco. He reports that he does not drink alcohol or use illicit drugs.  Outpatient Prescriptions Prior to Visit  Medication Sig Dispense Refill  . CLINPRO 5000 1.1 % PSTE APPLY PEA SIZE AMOUNT TO TOOTHBRUSH. FOR 2 MINUTES, RINSE AND SPIT.  6  . divalproex (DEPAKOTE) 500 MG DR tablet TAKE 1 TABLET BY MOUTH IN THE MORNING, TAKE 1 TABLET AT MID-DAY, AND 2 TABLETS AT BEDTIME 360 tablet 3  . divalproex (DEPAKOTE) 500 MG DR tablet TAKE 1 TABLET BY MOUTH IN THE MORNING, TAKE 1 TABLET AT MID-DAY, AND 2 TABLETS AT BEDTIME 360 tablet 3  . INTUNIV 4 MG TB24     . TEGRETOL-XR 100 MG 12 hr tablet TAKE 2 TABLETS BY MOUTH IN THE MORNING, THEN 1 TAB AT NOON AND THEN 2 AT BEDTIME 450 tablet 3  . VYVANSE 60 MG capsule Takes 50mg  per MOM     No facility-administered medications prior to visit.    ROS Review of Systems  Constitutional: Negative for fever, chills and diaphoresis.  HENT: Negative for congestion,  rhinorrhea and sore throat.   Respiratory: Negative for cough, shortness of breath and wheezing.   Cardiovascular: Negative for chest pain.  Gastrointestinal: Negative for nausea, vomiting, abdominal pain, diarrhea, constipation and abdominal distention.  Genitourinary: Negative for dysuria and frequency.  Musculoskeletal: Negative for joint swelling and arthralgias.  Skin: Negative for rash.  Neurological: Negative for headaches.    Objective:  BP 120/76 mmHg  Pulse 84  Temp(Src) 97.9 F (36.6 C) (Oral)  Ht 5\' 7"  (1.702 m)  Wt 173 lb (78.472 kg)  BMI 27.09 kg/m2  SpO2 99%  BP Readings from Last 3 Encounters:  07/11/15 120/76  03/08/15 116/70  01/07/15 131/84    Wt Readings from Last 3 Encounters:  07/11/15 173 lb (78.472 kg)  03/08/15 173 lb 9.6 oz (78.744 kg)  01/07/15 175 lb (79.379 kg)     Physical Exam  Constitutional: He is oriented to person, place, and time. He appears well-developed and well-nourished. No distress.  HENT:  Head: Normocephalic and atraumatic.  Right Ear: External ear normal.  Left Ear: External ear normal.  Nose: Nose normal.  Mouth/Throat: Oropharynx is clear and moist.  Eyes: Conjunctivae and EOM are normal. Pupils are equal, round, and reactive to light.  Neck: Normal range of motion. Neck supple. No thyromegaly present.  Cardiovascular: Normal rate, regular rhythm and normal heart  sounds.   No murmur heard. Pulmonary/Chest: Effort normal and breath sounds normal. No respiratory distress. He has no wheezes. He has no rales.  Abdominal: Soft. Bowel sounds are normal. He exhibits no distension. There is no tenderness.  Lymphadenopathy:    He has no cervical adenopathy.  Neurological: He is alert and oriented to person, place, and time. He has normal reflexes.  Skin: Skin is warm and dry.  Psychiatric: He has a normal mood and affect. His behavior is normal. Judgment and thought content normal.  The patient is affable, kind, cooperative. He  does not comment when mom describes his behavior. When asked about it he said he was just trying to get some exercise. He has no insights into the other episodes that have occurred.    No results found for: HGBA1C  Lab Results  Component Value Date   WBC 5.9 12/03/2014   HGB 16.5 12/03/2014   HCT 53.0 12/03/2014   PLT 265 04/06/2013   GLUCOSE 90 12/03/2014   CHOL 161 12/03/2014   TRIG 77 12/03/2014   HDL 65 12/03/2014   LDLCALC 81 12/03/2014   ALT 12 12/03/2014   AST 15 12/03/2014   NA 138 12/03/2014   K 4.3 12/03/2014   CL 98 12/03/2014   CREATININE 0.62* 12/03/2014   BUN 19 12/03/2014   CO2 24 12/03/2014    No results found.  Assessment & Plan:   Trevor Lee was seen today for 8 mth check up.  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 For OCD behavior -     Flu Vaccine QUAD 36+ mos IM -     Tdap vaccine greater than or equal to 7yo IM   I am having [name] start on sertraline. I am also having him maintain his INTUNIV, VYVANSE, CLINPRO 5000, divalproex, divalproex, and TEGRETOL-XR.  Meds ordered this encounter  Medications  . sertraline (ZOLOFT) 50 MG tablet    Sig: Take 1 tablet (50 mg total) by mouth daily. For OCD behavior    Dispense:  30 tablet    Refill:  1     Follow-up: Return in about 1 month (around 08/10/2015).  Claretta Fraise, M.D.

## 2015-07-13 ENCOUNTER — Other Ambulatory Visit (INDEPENDENT_AMBULATORY_CARE_PROVIDER_SITE_OTHER): Payer: 59

## 2015-07-13 DIAGNOSIS — Z79899 Other long term (current) drug therapy: Secondary | ICD-10-CM

## 2015-07-15 LAB — CARBAMAZEPINE LEVEL, TOTAL: Carbamazepine Lvl: 6.9 ug/mL (ref 4.0–12.0)

## 2015-07-22 ENCOUNTER — Institutional Professional Consult (permissible substitution) (INDEPENDENT_AMBULATORY_CARE_PROVIDER_SITE_OTHER): Payer: 59 | Admitting: Family

## 2015-07-22 DIAGNOSIS — F902 Attention-deficit hyperactivity disorder, combined type: Secondary | ICD-10-CM | POA: Diagnosis not present

## 2015-07-22 DIAGNOSIS — F8181 Disorder of written expression: Secondary | ICD-10-CM | POA: Diagnosis not present

## 2015-08-09 ENCOUNTER — Encounter: Payer: Self-pay | Admitting: Family Medicine

## 2015-08-09 ENCOUNTER — Ambulatory Visit (INDEPENDENT_AMBULATORY_CARE_PROVIDER_SITE_OTHER): Payer: 59 | Admitting: Family Medicine

## 2015-08-09 VITALS — BP 123/75 | HR 84 | Temp 97.0°F | Ht 67.0 in | Wt 182.2 lb

## 2015-08-09 DIAGNOSIS — F429 Obsessive-compulsive disorder, unspecified: Secondary | ICD-10-CM | POA: Diagnosis not present

## 2015-08-09 DIAGNOSIS — F902 Attention-deficit hyperactivity disorder, combined type: Secondary | ICD-10-CM | POA: Diagnosis not present

## 2015-08-09 DIAGNOSIS — Z79899 Other long term (current) drug therapy: Secondary | ICD-10-CM | POA: Diagnosis not present

## 2015-08-09 DIAGNOSIS — G808 Other cerebral palsy: Secondary | ICD-10-CM

## 2015-08-09 NOTE — Progress Notes (Signed)
Subjective:  Patient ID: Trevor Lee, male    DOB: [birth year]  Age: 24 y.o. MRN: [id number]  CC: OCD and Seizures   HPI Trevor Lee presents for recheck of his behavior and developmental delay. Trevor Lee is overall very happy and congenial. However his mother has noted a great ill of negative hostility toward her. On a few occasions he has become violent although he has not been physical with this violence as yet. Mom feels it may have been some improvement with the use of the Zoloft. However he still displays quite a bit of OCD symptoms. Mom also says that 5 aunts was increased by neurology from 60 mg to 70 mg due to some increasing ADD symptoms. However she feels that he is more OCD at this time.  Additionally they have been trying to find a new arrangement for psychology and psychiatry as Trevor Lee is aging out of his current pediatric/adolescent arrangement. There is also concerned that he might need a male influence in this area.  Trevor Lee remained under the care pediatric neurology as well. 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 Additionally he's been getting Botox for spasticity in the neck and left arm and possibly other areas.   History Trevor Lee has a past medical history of Seizures ([place]).   He has past surgical history that includes Tendon transfer elbow (Left, December 2013).   His family history is not on file.He reports that he has never smoked. He has never used smokeless tobacco. He reports that he does not drink alcohol or use illicit drugs.  Outpatient Prescriptions Prior to Visit  Medication Sig Dispense Refill  . CLINPRO 5000 1.1 % PSTE APPLY PEA SIZE AMOUNT TO TOOTHBRUSH. FOR 2 MINUTES, RINSE AND SPIT.  6  . divalproex (DEPAKOTE) 500 MG DR tablet TAKE 1 TABLET BY MOUTH IN THE MORNING, TAKE 1 TABLET AT MID-DAY, AND 2 TABLETS AT BEDTIME 360 tablet 3  . INTUNIV 4 MG TB24     . sertraline (ZOLOFT) 50 MG tablet Take 1 tablet (50 mg  total) by mouth daily. For OCD behavior 30 tablet 1  . TEGRETOL-XR 100 MG 12 hr tablet TAKE 2 TABLETS BY MOUTH IN THE MORNING, THEN 1 TAB AT NOON AND THEN 2 AT BEDTIME 450 tablet 3  . divalproex (DEPAKOTE) 500 MG DR tablet TAKE 1 TABLET BY MOUTH IN THE MORNING, TAKE 1 TABLET AT MID-DAY, AND 2 TABLETS AT BEDTIME 360 tablet 3  . VYVANSE 60 MG capsule Takes 50mg  per MOM     No facility-administered medications prior to visit.    ROS Review of Systems  Constitutional: Negative for fever, chills and diaphoresis.  HENT: Negative for rhinorrhea and sore throat.   Respiratory: Negative for cough and shortness of breath.   Cardiovascular: Negative for chest pain.  Gastrointestinal: Negative for abdominal pain.  Musculoskeletal: Positive for myalgias (left arm). Negative for arthralgias.  Skin: Negative for rash.  Neurological: Positive for speech difficulty (apraxia noted for expression). Negative for weakness and headaches.  Psychiatric/Behavioral: Positive for dysphoric mood and agitation (,Intermittently).    Objective:  BP 123/75 mmHg  Pulse 84  Temp(Src) 97 F (36.1 C) (Oral)  Ht 5\' 7"  (1.702 m)  Wt 182 lb 3.2 oz (82.645 kg)  BMI 28.53 kg/m2  SpO2 96%  BP Readings from Last 3 Encounters:  08/09/15 123/75  07/11/15 120/76  03/08/15 116/70    Wt Readings from Last 3 Encounters:  08/09/15 182 lb 3.2 oz (  82.645 kg)  07/11/15 173 lb (78.472 kg)  03/08/15 173 lb 9.6 oz (78.744 kg)     Physical Exam  Constitutional: He is oriented to person, place, and time. He appears well-developed and well-nourished. No distress.  HENT:  Head: Normocephalic and atraumatic.  Right Ear: External ear normal.  Left Ear: External ear normal.  Nose: Nose normal.  Mouth/Throat: Oropharynx is clear and moist.  Eyes: Conjunctivae and EOM are normal. Pupils are equal, round, and reactive to light.  Neck: Normal range of motion. Neck supple. No thyromegaly present.  Cardiovascular: Normal rate,  regular rhythm and normal heart sounds.   No murmur heard. Pulmonary/Chest: Effort normal and breath sounds normal. No respiratory distress. He has no wheezes. He has no rales.  Abdominal: Soft. Bowel sounds are normal. He exhibits no distension. There is no tenderness.  Lymphadenopathy:    He has no cervical adenopathy.  Neurological: He is alert and oriented to person, place, and time. He has normal reflexes.  Skin: Skin is warm and dry.  Psychiatric: His behavior is normal. Thought content normal. His affect is labile. His speech is tangential and slurred. Cognition and memory are impaired. He expresses impulsivity and inappropriate judgment.  Trevor Lee is generally a very pleasant and cheerful young man in the office. He is quite affectionate. However some lability is noted with him becoming more quiet and withdrawn at times during the evaluation. This is consistent with his mother's concerns about his mood swings toward hostility toward her.    No results found for: HGBA1C  Lab Results  Component Value Date   WBC 5.9 12/03/2014   HGB 16.5 12/03/2014   HCT 53.0 12/03/2014   PLT 265 04/06/2013   GLUCOSE 90 12/03/2014   CHOL 161 12/03/2014   TRIG 77 12/03/2014   HDL 65 12/03/2014   LDLCALC 81 12/03/2014   ALT 12 12/03/2014   AST 15 12/03/2014   NA 138 12/03/2014   K 4.3 12/03/2014   CL 98 12/03/2014   CREATININE 0.62* 12/03/2014   BUN 19 12/03/2014   CO2 24 12/03/2014    No results found.  Assessment & Plan:   Pankaj was seen today for ocd and seizures.  Diagnoses and all orders for this visit:  OCD (obsessive compulsive disorder) -     ARIPiprazole (ABILIFY) 5 MG tablet; Take 1 tablet (5 mg total) by mouth daily. 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 -     Ambulatory referral to Psychiatry   I am having Mr. Chaffins start on ARIPiprazole. I am also having him maintain his INTUNIV, CLINPRO 5000, divalproex, TEGRETOL-XR, sertraline, VYVANSE, and BOTOX.  Meds ordered this encounter  Medications  . VYVANSE 70 MG capsule    Sig: Take 70 mg by mouth every morning.    Refill:  0  . BOTOX 100 UNITS SOLR injection    Sig:   . ARIPiprazole (ABILIFY) 5 MG tablet    Sig: Take 1 tablet (5 mg total) by mouth daily.    Dispense:  30 tablet    Refill:  1   The above medications were updated, not ordered.  Follow-up: Return in about 1 month (around 09/09/2015).  Claretta Fraise, M.D.

## 2015-08-11 MED ORDER — ARIPIPRAZOLE 5 MG PO TABS: 5.0000 mg | ORAL_TABLET | Freq: Every day | ORAL | Status: DC

## 2015-08-20 ENCOUNTER — Telehealth: Payer: Self-pay | Admitting: Family Medicine

## 2015-08-20 NOTE — Telephone Encounter (Signed)
Please advise and route to Pool B 

## 2015-08-20 NOTE — Telephone Encounter (Signed)
This is normal. Please have him take it at bedtime and see if that helps.

## 2015-08-21 ENCOUNTER — Institutional Professional Consult (permissible substitution): Payer: Self-pay | Admitting: Family

## 2015-08-21 NOTE — Telephone Encounter (Signed)
Patient mother aware and will have him take at bedtime.

## 2015-08-23 ENCOUNTER — Other Ambulatory Visit: Payer: Self-pay | Admitting: Family Medicine

## 2015-08-24 ENCOUNTER — Other Ambulatory Visit: Payer: Self-pay | Admitting: *Deleted

## 2015-08-24 MED ORDER — SERTRALINE HCL 50 MG PO TABS: ORAL_TABLET | ORAL | Status: DC

## 2015-08-30 ENCOUNTER — Other Ambulatory Visit: Payer: Self-pay

## 2015-08-30 MED ORDER — SERTRALINE HCL 50 MG PO TABS: ORAL_TABLET | ORAL | Status: DC

## 2015-09-11 ENCOUNTER — Ambulatory Visit (INDEPENDENT_AMBULATORY_CARE_PROVIDER_SITE_OTHER): Payer: 59 | Admitting: Family Medicine

## 2015-09-11 ENCOUNTER — Encounter: Payer: Self-pay | Admitting: Family Medicine

## 2015-09-11 VITALS — BP 123/79 | HR 95 | Temp 97.2°F | Ht 67.0 in | Wt 183.6 lb

## 2015-09-11 DIAGNOSIS — Z79899 Other long term (current) drug therapy: Secondary | ICD-10-CM | POA: Diagnosis not present

## 2015-09-11 DIAGNOSIS — F429 Obsessive-compulsive disorder, unspecified: Secondary | ICD-10-CM

## 2015-09-11 MED ORDER — ARIPIPRAZOLE 10 MG PO TABS: 10.0000 mg | ORAL_TABLET | Freq: Every day | ORAL | Status: DC

## 2015-09-11 NOTE — Progress Notes (Signed)
Subjective:  Patient ID: Trevor Lee, male    DOB: 05/23/1991  Age: 25 y.o. MRN: [id number]  CC: OCD and Seizures   HPI Trevor Lee presents for recheck of his behavior and developmental delay. Trevor Lee is overall very happy and congenial. However his mother has noted a great ill of negative hostility toward her. On a few occasions he has become violent although he has not been physical with this violence as yet. Mom feels it may have been some improvement with the use of the Zoloft. However he still displays quite a bit of OCD symptoms. Mom also says that 5 aunts was increased by neurology from 60 mg to 70 mg due to some increasing ADD symptoms. However she feels that he is more OCD at this time.  Additionally they have been trying to find a new arrangement for psychology and psychiatry as Trevor Lee is aging out of his current pediatric/adolescent arrangement. There is also concerned that he might need a male influence in this area.  Trevor Lee remained under the care pediatric neurology as well. 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 Additionally he's been getting Botox for spasticity in the neck and left arm and possibly other areas.   History Trevor Lee has a past medical history of Seizures ([place]).   He has past surgical history that includes Tendon transfer elbow (Left, December 2013).   His family history is not on file.He reports that he has never smoked. He has never used smokeless tobacco. He reports that he does not drink alcohol or use illicit drugs.  Outpatient Prescriptions Prior to Visit  Medication Sig Dispense Refill  . BOTOX 100 UNITS SOLR injection     . CLINPRO 5000 1.1 % PSTE APPLY PEA SIZE AMOUNT TO TOOTHBRUSH. FOR 2 MINUTES, RINSE AND SPIT.  6  . divalproex (DEPAKOTE) 500 MG DR tablet TAKE 1 TABLET BY MOUTH IN THE MORNING, TAKE 1 TABLET AT MID-DAY, AND 2 TABLETS AT BEDTIME 360 tablet 3  . INTUNIV 4 MG TB24     . sertraline (ZOLOFT)  50 MG tablet TAKE 1 TABLET (50 MG TOTAL) BY MOUTH DAILY. FOR OCD BEHAVIOR 90 tablet 0  . TEGRETOL-XR 100 MG 12 hr tablet TAKE 2 TABLETS BY MOUTH IN THE MORNING, THEN 1 TAB AT NOON AND THEN 2 AT BEDTIME 450 tablet 3  . VYVANSE 70 MG capsule Take 70 mg by mouth every morning.  0  . ARIPiprazole (ABILIFY) 5 MG tablet Take 1 tablet (5 mg total) by mouth daily. 30 tablet 1   No facility-administered medications prior to visit.    ROS Review of Systems  Constitutional: Negative for fever, chills and diaphoresis.  HENT: Negative for rhinorrhea and sore throat.   Respiratory: Negative for cough and shortness of breath.   Cardiovascular: Negative for chest pain.  Gastrointestinal: Negative for abdominal pain.  Musculoskeletal: Positive for myalgias (left arm). Negative for arthralgias.  Skin: Negative for rash.  Neurological: Positive for speech difficulty (apraxia noted for expression). Negative for weakness and headaches.  Psychiatric/Behavioral: Positive for dysphoric mood. Negative for agitation (,Intermittently).    Objective:  BP 123/79 mmHg  Pulse 95  Temp(Src) 97.2 F (36.2 C) (Oral)  Ht 5\' 7"  (1.702 m)  Wt 183 lb 9.6 oz (83.28 kg)  BMI 28.75 kg/m2  SpO2 99%  BP Readings from Last 3 Encounters:  09/11/15 123/79  08/09/15 123/75  07/11/15 120/76    Wt Readings from Last 3 Encounters:  09/11/15  183 lb 9.6 oz (83.28 kg)  08/09/15 182 lb 3.2 oz (82.645 kg)  07/11/15 173 lb (78.472 kg)     Physical Exam  Constitutional: He is oriented to person, place, and time. He appears well-developed and well-nourished. No distress.  HENT:  Head: Normocephalic and atraumatic.  Right Ear: External ear normal.  Left Ear: External ear normal.  Nose: Nose normal.  Mouth/Throat: Oropharynx is clear and moist.  Eyes: Conjunctivae and EOM are normal. Pupils are equal, round, and reactive to light.  Neck: Normal range of motion. Neck supple. No thyromegaly present.  Cardiovascular: Normal  rate, regular rhythm and normal heart sounds.   No murmur heard. Pulmonary/Chest: Effort normal and breath sounds normal. No respiratory distress. He has no wheezes. He has no rales.  Abdominal: Soft. Bowel sounds are normal. He exhibits no distension. There is no tenderness.  Lymphadenopathy:    He has no cervical adenopathy.  Neurological: He is alert and oriented to person, place, and time. He has normal reflexes.  Skin: Skin is warm and dry.  Psychiatric: His behavior is normal. Thought content normal. His affect is labile. His speech is tangential and slurred. Cognition and memory are impaired. He expresses impulsivity and inappropriate judgment.  Trevor Lee is generally a very pleasant and cheerful young man in the office. He is quite affectionate. However some lability is noted with him becoming more quiet and withdrawn at times during the evaluation. This is consistent with his mother's concerns about his mood swings toward hostility toward her.     No results found for: HGBA1C  Lab Results  Component Value Date   WBC 5.9 12/03/2014   HGB 16.5 12/03/2014   HCT 53.0 12/03/2014   PLT 265 04/06/2013   GLUCOSE 90 12/03/2014   CHOL 161 12/03/2014   TRIG 77 12/03/2014   HDL 65 12/03/2014   LDLCALC 81 12/03/2014   ALT 12 12/03/2014   AST 15 12/03/2014   NA 138 12/03/2014   K 4.3 12/03/2014   CL 98 12/03/2014   CREATININE 0.62* 12/03/2014   BUN 19 12/03/2014   CO2 24 12/03/2014    No results found.  Assessment & Plan:   Trevor Lee was seen today for ocd and seizures.  Diagnoses and all orders for this visit:  OCD (obsessive compulsive disorder) -     ARIPiprazole (ABILIFY) 10 MG tablet; Take 1 tablet (10 mg total) by mouth daily.  Long-term use of high-risk medication -     ARIPiprazole (ABILIFY) 10 MG tablet; Take 1 tablet (10 mg total) by mouth daily.   I have changed Trevor Lee ARIPiprazole. I am also having him maintain his INTUNIV, CLINPRO 5000, divalproex,  TEGRETOL-XR, VYVANSE, BOTOX, and sertraline.  Meds ordered this encounter  Medications  . ARIPiprazole (ABILIFY) 10 MG tablet    Sig: Take 1 tablet (10 mg total) by mouth daily.    Dispense:  30 tablet    Refill:  2    Follow-up: Return in about 3 months (around 12/10/2015).  Claretta Fraise, M.D.

## 2015-09-20 ENCOUNTER — Telehealth: Payer: Self-pay | Admitting: Family Medicine

## 2015-09-20 NOTE — Telephone Encounter (Signed)
Last OV faxed to [facility]

## 2015-10-02 ENCOUNTER — Other Ambulatory Visit: Payer: Self-pay

## 2015-10-02 DIAGNOSIS — F429 Obsessive-compulsive disorder, unspecified: Secondary | ICD-10-CM

## 2015-10-02 DIAGNOSIS — Z79899 Other long term (current) drug therapy: Secondary | ICD-10-CM

## 2015-10-02 MED ORDER — ARIPIPRAZOLE 10 MG PO TABS: 10.0000 mg | ORAL_TABLET | Freq: Every day | ORAL | Status: DC

## 2015-10-05 ENCOUNTER — Other Ambulatory Visit: Payer: Self-pay | Admitting: Family Medicine

## 2015-10-22 ENCOUNTER — Institutional Professional Consult (permissible substitution): Payer: Self-pay | Admitting: Family

## 2016-03-24 ENCOUNTER — Other Ambulatory Visit: Payer: Self-pay | Admitting: Pediatrics

## 2016-04-08 ENCOUNTER — Encounter: Payer: Self-pay | Admitting: Family

## 2016-04-08 ENCOUNTER — Ambulatory Visit (INDEPENDENT_AMBULATORY_CARE_PROVIDER_SITE_OTHER): Payer: 59 | Admitting: Family

## 2016-04-08 VITALS — BP 120/70 | HR 84 | Ht 67.0 in | Wt 192.0 lb

## 2016-04-08 DIAGNOSIS — F429 Obsessive-compulsive disorder, unspecified: Secondary | ICD-10-CM

## 2016-04-08 DIAGNOSIS — R482 Apraxia: Secondary | ICD-10-CM

## 2016-04-08 DIAGNOSIS — R269 Unspecified abnormalities of gait and mobility: Secondary | ICD-10-CM | POA: Diagnosis not present

## 2016-04-08 DIAGNOSIS — G8110 Spastic hemiplegia affecting unspecified side: Secondary | ICD-10-CM | POA: Diagnosis not present

## 2016-04-08 DIAGNOSIS — F902 Attention-deficit hyperactivity disorder, combined type: Secondary | ICD-10-CM

## 2016-04-08 DIAGNOSIS — G808 Other cerebral palsy: Secondary | ICD-10-CM | POA: Diagnosis not present

## 2016-04-08 DIAGNOSIS — G40109 Localization-related (focal) (partial) symptomatic epilepsy and epileptic syndromes with simple partial seizures, not intractable, without status epilepticus: Secondary | ICD-10-CM | POA: Diagnosis not present

## 2016-04-08 DIAGNOSIS — G40409 Other generalized epilepsy and epileptic syndromes, not intractable, without status epilepticus: Secondary | ICD-10-CM

## 2016-04-08 NOTE — Progress Notes (Signed)
Patient: DELANTE LLANOS MRN: [id number] Sex: male DOB: [birth year]  Provider: Rockwell Germany, NP Location of Care: [facility]  Note type: Routine return visit  History of Present Illness: History from: mother, patient and [facility] chart Chief Complaint: Seizures  BRIANNE MUHS is a 25 y.o. 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 He was last seen March 08, 2015.  Mom reports today that [name] has had no witnessed seizures since last seen. He is taking and tolerating brand Tegretol XR and generic Divalproex for his seizure disorder.   [name] goes to a day program 3 days per week and has been working as part of the day program. 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   Mom said that [name] has been otherwise healthy since last seen. He joined the [employer] and is working out with a Clinical research associate three times per week. There are no problems with behavior or mood since stopping Abilify and Sertraline. Mom has no other complaints or concerns about [name]'s health today.   Review of Systems: Please see the HPI for neurologic and other pertinent review of systems. Otherwise, the following systems are noncontributory including constitutional, eyes, ears, nose and throat, cardiovascular, respiratory, gastrointestinal, genitourinary, musculoskeletal, skin, endocrine, hematologic/lymph, allergic/immunologic and psychiatric.   Past Medical History:  Diagnosis Date  . Seizures ([place])    Hospitalizations: No., Head Injury: No., Nervous System Infections: No., Immunizations up to date: Yes.   Past Medical History Comments: See history  Surgical History Past Surgical History:    Procedure Laterality Date  . TENDON TRANSFER ELBOW Left December 2013    Family History family history is not on file. Family History is otherwise negative for migraines, seizures, cognitive impairment, blindness, deafness, birth defects, chromosomal disorder, autism.  Social History Social History   Social History  . Marital status: Single    Spouse name: N/A  . Number of children: N/A  . Years of education: N/A   Social History Main Topics  . Smoking status: Never Smoker  . Smokeless tobacco: Never Used  . Alcohol use No  . Drug use: No  . Sexual activity: No   Other Topics Concern  . None   Social History Narrative   He graduated from [employer] where he was in a self contained class setting. He has had difficulty with attention span, and difficulty keeping up with the pace of regular classes. [name] is now in enrolled in a day program 5 days per week. He enjoys riding his bike, [employer] basketball, listening to music, computer and playing music. He is employed at the [employer] where he does [employer].    Allergies No Known Allergies  Physical Exam BP 120/70   Pulse 84   Ht 5\' 7"  (1.702 m)   Wt 192 lb (87.1 kg)   BMI 30.07 kg/m  General: alert, well nourished, well hydrated young man, no acute distress, brown hair, brown eyes, left-handed  Head: normocephalic, no dysmorphic features  Ears, Nose and Throat: no signs of infection in tympanic membranes, or the oropharynx  Neck: supple neck, full range of motion, no cranial or cervical bruits  Respiratory: lungs clear to auscultation  Cardiovascular: normal pulses,  no murmurs  Musculoskeletal: spastic contractures of the left arm and leg with left wrist drop, dystonic 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 He has a tight heel cord  on the right, and spasticity with decreased range of motion at the elbow clawhand deformity on the left. He is in an ankle-foot orthosis   Neurologic Exam  Mental Status: oriented to name. He is able to name objects, follow commands fairly well. He can be somewhat silly at times.  Cranial Nerves: round reactive pupils, normal fundi, full visual fields to double simultaneous stimuli, symmetric facial strength, midline tongue and uvula. He turns localized sound.  Motor: Spastic left hemiparesis with normal strength and good fine motor movements on the right and mild to moderate weakness on the left constrained by spasticity and significant fine motor incoordination  Sensory: withdrawal x4 to noxious stimuli  Coordination: normal coordination on the right, poor coordination on the left  Gait and Station: left hemiparetic, but he swings his arms and his left foot well because of his brace. At times he actually points left foot straighter than the right one because it is braced. Left arm is semiflexed and slightly adducted as he walks.  Reflexes: left reflex predominance, left extensor plantar response   Impression 1. Localization related epilepsy 2. Congenital left hemiparesis with left hemiatrophy 3. 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 Severe attention deficit disorder  Recommendations for plan of care The patient's [facility] were reviewed. Ritter has neither had nor required imaging or lab studies since the last visit. 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 His seizures are in good control at this time. He will continue his medications without change for now and will return in 1 year or sooner if needed. Mom  agreed with these plans.    The medication list was reviewed and reconciled.  No changes were made in the prescribed medications today.  A complete medication list was provided to the patient/caregiver.    Medication List       Accurate as of 04/08/16 11:59 PM. Always use your most recent med list.          CLINPRO 5000 1.1 % Pste Generic drug:  Sodium Fluoride APPLY PEA SIZE AMOUNT TO TOOTHBRUSH. FOR 2 MINUTES, RINSE AND SPIT.   divalproex 500 MG DR tablet Commonly known as:  DEPAKOTE TAKE 1 TABLET BY MOUTH IN THE MORNING, TAKE 1 TABLET AT MID-DAY, AND 2 TABLETS AT BEDTIME   INTUNIV 4 MG Tb24 SR tablet Generic drug:  guanFACINE   TEGRETOL-XR 100 MG 12 hr tablet Generic drug:  carbamazepine TAKE 2 TABLETS BY MOUTH IN THE MORNING, THEN 1 TAB AT NOON AND THEN 2 AT BEDTIME   VYVANSE 70 MG capsule Generic drug:  lisdexamfetamine Take 70 mg by mouth every morning.       Total time spent with the patient was 20 minutes, of which 50% or more was spent in counseling and coordination of care.   Rockwell Germany

## 2016-04-08 NOTE — Patient Instructions (Signed)
Continue your medications as you have been taking them. Let me know if you have any breakthrough seizures.   Please plan to return for follow up in 1 year or sooner if needed.

## 2016-06-22 ENCOUNTER — Other Ambulatory Visit: Payer: Self-pay | Admitting: Family

## 2016-06-29 ENCOUNTER — Other Ambulatory Visit: Payer: Self-pay | Admitting: Family

## 2016-06-29 DIAGNOSIS — G40109 Localization-related (focal) (partial) symptomatic epilepsy and epileptic syndromes with simple partial seizures, not intractable, without status epilepticus: Secondary | ICD-10-CM

## 2016-06-29 DIAGNOSIS — G40119 Localization-related (focal) (partial) symptomatic epilepsy and epileptic syndromes with simple partial seizures, intractable, without status epilepticus: Secondary | ICD-10-CM

## 2017-01-25 ENCOUNTER — Ambulatory Visit (INDEPENDENT_AMBULATORY_CARE_PROVIDER_SITE_OTHER): Payer: 59 | Admitting: Pediatrics

## 2017-01-25 ENCOUNTER — Encounter: Payer: Self-pay | Admitting: Pediatrics

## 2017-01-25 VITALS — BP 134/85 | HR 98 | Temp 98.5°F | Ht 67.0 in | Wt 201.8 lb

## 2017-01-25 DIAGNOSIS — R002 Palpitations: Secondary | ICD-10-CM | POA: Diagnosis not present

## 2017-01-25 DIAGNOSIS — R030 Elevated blood-pressure reading, without diagnosis of hypertension: Secondary | ICD-10-CM | POA: Diagnosis not present

## 2017-01-25 LAB — URINALYSIS, COMPLETE
BILIRUBIN UA: NEGATIVE
GLUCOSE, UA: NEGATIVE
KETONES UA: NEGATIVE
Leukocytes, UA: NEGATIVE
NITRITE UA: NEGATIVE
PROTEIN UA: NEGATIVE
RBC UA: NEGATIVE
SPEC GRAV UA: 1.02 (ref 1.005–1.030)
UUROB: 1 mg/dL (ref 0.2–1.0)
pH, UA: 7.5 (ref 5.0–7.5)

## 2017-01-25 LAB — MICROSCOPIC EXAMINATION
BACTERIA UA: NONE SEEN
Epithelial Cells (non renal): NONE SEEN /hpf (ref 0–10)
RBC MICROSCOPIC, UA: NONE SEEN /HPF (ref 0–?)
Renal Epithel, UA: NONE SEEN /hpf
WBC UA: NONE SEEN /HPF (ref 0–?)

## 2017-01-25 NOTE — Patient Instructions (Signed)
Check blood pressure in the morning and before dinner Bring machine to next visit

## 2017-01-25 NOTE — Progress Notes (Signed)
  Subjective:   Patient ID: Trevor Lee, male    DOB: [birth year], 26 y.o.   MRN: [id number] CC: Elevated Heart Rate and Hypertension  HPI: Trevor Lee is a 26 y.o. male presenting for Elevated Heart Rate and Hypertension here today with mother, pt's guardian  Yesterday at grandparents house he felt his heart "beep" GF checked his BP, was in 140s/90s HR was 117 Rechecked in 15 min was 114/64, HR 104 Rechecked again 15 min later, was 140s  Has been on vyvanse, intuniv since high school, helping some with focus now Working two days a week  Otherwise acting normal self, denies any CP  Relevant past medical, surgical, family and social history reviewed. Allergies and medications reviewed and updated. History  Smoking Status  . Never Smoker  Smokeless Tobacco  . Never Used   ROS: Per HPI   Objective:    BP 134/85   Pulse 98   Temp 98.5 F (36.9 C) (Oral)   Ht '5\' 7"'$  (1.702 m)   Wt 201 lb 12.8 oz (91.5 kg)   BMI 31.61 kg/m   Wt Readings from Last 3 Encounters:  01/25/17 201 lb 12.8 oz (91.5 kg)  04/08/16 192 lb (87.1 kg)  09/11/15 183 lb 9.6 oz (83.3 kg)    Gen: NAD, alert, cooperative with exam, NCAT EYES: EOMI, no conjunctival injection, or no icterus ENT:   OP without erythema LYMPH: no cervical LAD CV: NRRR, normal S1/S2, no murmur, distal pulses 2+ b/l Resp: CTABL, no wheezes, normal WOB Ext: No edema, warm Neuro: Alert and oriented to person, place. Answers questions appropriately, mom gives most of history  EKG: HR 95, nsr, narrow complex QRS  Assessment & Plan:  [name] was seen today for elevated heart rate and hypertension.  Diagnoses and all orders for this visit:  Elevated blood pressure reading New problem Some elevated BPs, some normal BPs in clinic past 2 years per chart review Asymptomatic now Mom will help check Bps over next two weeks Check below labs If remains elevated could consider trial off of vyvanse -     BMP8+EGFR -      CBC with Differential/Platelet -     Urinalysis, Complete -     EKG 12-Lead  Palpitation NSR -     EKG 12-Lead  Other orders -     Microscopic Examination  Follow up plan: Return in about 2 weeks (around 02/08/2017). Assunta Found, MD [place]

## 2017-01-26 LAB — BMP8+EGFR
BUN/Creatinine Ratio: 27 — ABNORMAL HIGH (ref 9–20)
BUN: 16 mg/dL (ref 6–20)
CALCIUM: 9.7 mg/dL (ref 8.7–10.2)
CO2: 25 mmol/L (ref 18–29)
Chloride: 99 mmol/L (ref 96–106)
Creatinine, Ser: 0.59 mg/dL — ABNORMAL LOW (ref 0.76–1.27)
GFR, EST AFRICAN AMERICAN: 165 mL/min/{1.73_m2} (ref >59)
GFR, EST NON AFRICAN AMERICAN: 143 mL/min/{1.73_m2} (ref >59)
Glucose: 85 mg/dL (ref 65–99)
Potassium: 4.2 mmol/L (ref 3.5–5.2)
Sodium: 139 mmol/L (ref 134–144)

## 2017-01-26 LAB — CBC WITH DIFFERENTIAL/PLATELET
BASOS ABS: 0 10*3/uL (ref 0.0–0.2)
BASOS: 0 %
EOS (ABSOLUTE): 0.5 10*3/uL — ABNORMAL HIGH (ref 0.0–0.4)
Eos: 5 %
HEMATOCRIT: 45.6 % (ref 37.5–51.0)
Hemoglobin: 15.9 g/dL (ref 13.0–17.7)
IMMATURE GRANS (ABS): 0 10*3/uL (ref 0.0–0.1)
Immature Granulocytes: 0 %
LYMPHS: 47 %
Lymphocytes Absolute: 4.6 10*3/uL — ABNORMAL HIGH (ref 0.7–3.1)
MCH: 32.6 pg (ref 26.6–33.0)
MCHC: 34.9 g/dL (ref 31.5–35.7)
MCV: 93 fL (ref 79–97)
MONOCYTES: 9 %
Monocytes Absolute: 0.9 10*3/uL (ref 0.1–0.9)
NEUTROS ABS: 4 10*3/uL (ref 1.4–7.0)
Neutrophils: 39 %
PLATELETS: 294 10*3/uL (ref 150–379)
RBC: 4.88 x10E6/uL (ref 4.14–5.80)
RDW: 12.6 % (ref 12.3–15.4)
WBC: 10.1 10*3/uL (ref 3.4–10.8)

## 2017-02-08 ENCOUNTER — Ambulatory Visit (INDEPENDENT_AMBULATORY_CARE_PROVIDER_SITE_OTHER): Payer: 59 | Admitting: Family Medicine

## 2017-02-08 ENCOUNTER — Encounter: Payer: Self-pay | Admitting: Family Medicine

## 2017-02-08 DIAGNOSIS — Z79899 Other long term (current) drug therapy: Secondary | ICD-10-CM | POA: Diagnosis not present

## 2017-02-08 DIAGNOSIS — F902 Attention-deficit hyperactivity disorder, combined type: Secondary | ICD-10-CM

## 2017-02-08 MED ORDER — LISDEXAMFETAMINE DIMESYLATE 60 MG PO CAPS: 60.0000 mg | ORAL_CAPSULE | ORAL | 0 refills | Status: DC

## 2017-02-08 NOTE — Progress Notes (Signed)
Subjective:  Patient ID: Trevor Lee, male    DOB: 1991-01-05  Age: 26 y.o. MRN: [id number]  CC: Blood Pressure Check (pt here today for a 2 week recheck on his BP and brought his BP log in with him as well as his BP monitor to check against ours.)   HPI Trevor Lee presents for  follow-up ofElevated blood pressure noticed a couple weeks ago. He had had an elevated pulse 215 so he and mom took blood pressure and noted it to be as high as 159/105. Since then they've been checking blood pressure regularly. That log is attached. The blood pressures tend to be in the mid  237S systolic. Pulse has dropped into the 80s and 90s with occasional bump up to 100 - 110. Patient has no history of headache chest pain or shortness of breath or recent cough. Patient also denies symptoms of TIA such as numbness weakness lateralizing. Patient checks  blood pressure at home and has not had any elevated readings recently. Patient denies side effects from medication. States taking it regularly. The patient has been treated for tension disorder for many years. He's been getting Vyvanse routinely. There has been no assessment to determine if it is still necessary for him. He interested in tapering it to see if that helps with the pulse and pressure.   History Trevor Lee has a past medical history of Autistic disorder and Seizures ([place]).   He has a past surgical history that includes Tendon transfer Trevor Lee (Left, December 2013).   His family history is not on file.He reports that he has never smoked. He has never used smokeless tobacco. He reports that he does not drink alcohol or use drugs.  Current Outpatient Prescriptions on File Prior to Visit  Medication Sig Dispense Refill  . CLINPRO 5000 1.1 % PSTE APPLY PEA SIZE AMOUNT TO TOOTHBRUSH. FOR 2 MINUTES, RINSE AND SPIT.  6  . divalproex (DEPAKOTE) 500 MG DR tablet TAKE 1 TABLET BY MOUTH IN THE MORNING, TAKE 1 TABLET AT MID-DAY, AND 2 TABLETS AT BEDTIME 360  tablet 3  . INTUNIV 4 MG TB24     . TEGRETOL-XR 100 MG 12 hr tablet TAKE 2 TABLETS BY MOUTH IN THE MORNING TAKE 1 TABLET AT NOON TAKE 2 TABLETS AT BEDTIME 450 tablet 3   No current facility-administered medications on file prior to visit.     ROS Review of Systems  Constitutional: Negative for chills, diaphoresis and fever.  HENT: Negative for rhinorrhea and sore throat.   Respiratory: Negative for cough and shortness of breath.   Cardiovascular: Negative for chest pain.  Gastrointestinal: Negative for abdominal pain.  Musculoskeletal: Negative for arthralgias and myalgias.  Skin: Negative for rash.  Neurological: Negative for weakness and headaches.    Objective:  BP 134/76   Pulse 90   Ht 5\' 7"  (1.702 m)   Wt 199 lb (90.3 kg)   BMI 31.17 kg/m   BP Readings from Last 3 Encounters:  02/08/17 134/76  01/25/17 134/85  04/08/16 120/70    Wt Readings from Last 3 Encounters:  02/08/17 199 lb (90.3 kg)  01/25/17 201 lb 12.8 oz (91.5 kg)  04/08/16 192 lb (87.1 kg)     Physical Exam  Constitutional: He is oriented to person, place, and time. He appears well-developed and well-nourished. No distress.  HENT:  Head: Normocephalic and atraumatic.  Right Ear: External ear normal.  Left Ear: External ear normal.  Nose: Nose normal.  Mouth/Throat: Oropharynx is  clear and moist.  Eyes: Conjunctivae and EOM are normal. Pupils are equal, round, and reactive to light.  Neck: Normal range of motion. Neck supple. No thyromegaly present.  Cardiovascular: Normal rate, regular rhythm and normal heart sounds.   No murmur heard. Pulmonary/Chest: Effort normal and breath sounds normal. No respiratory distress. He has no wheezes. He has no rales.  Abdominal: Soft. Bowel sounds are normal. He exhibits no distension. There is no tenderness.  Lymphadenopathy:    He has no cervical adenopathy.  Neurological: He is alert and oriented to person, place, and time. He has normal reflexes.  Skin:  Skin is warm and dry.  Psychiatric: He has a normal mood and affect. His behavior is normal. Judgment and thought content normal.     Lab Results  Component Value Date   WBC 10.1 01/25/2017   HGB 16.5 12/03/2014   HCT 45.6 01/25/2017   PLT 294 01/25/2017   GLUCOSE 85 01/25/2017   CHOL 161 12/03/2014   TRIG 77 12/03/2014   HDL 65 12/03/2014   LDLCALC 81 12/03/2014   ALT 12 12/03/2014   AST 15 12/03/2014   NA 139 01/25/2017   K 4.2 01/25/2017   CL 99 01/25/2017   CREATININE 0.59 (L) 01/25/2017   BUN 16 01/25/2017   CO2 25 01/25/2017    No results found.  Assessment & Plan:   Trevor Lee was seen today for blood pressure check.  Diagnoses and all orders for this visit:  Attention deficit hyperactivity disorder (ADHD), combined type  Long-term use of high-risk medication  Other orders -     lisdexamfetamine (VYVANSE) 60 MG capsule; Take 1 capsule (60 mg total) by mouth every morning.   I have discontinued Mr. Trevor Lee. I am also having him start on lisdexamfetamine. Additionally, I am having him maintain his INTUNIV, CLINPRO 5000, divalproex, and TEGRETOL-XR.  Meds ordered this encounter  Medications  . lisdexamfetamine (VYVANSE) 60 MG capsule    Sig: Take 1 capsule (60 mg total) by mouth every morning.    Dispense:  30 capsule    Refill:  0   Plan is to taper by 10 mg every month until either ADD symptoms recur or the medication has tapered all the way off. Continue to monitor the blood pressure. Goal should be approximately 135/85. Pulse under 100. I believe that as we taper the Vyvanse the blood pressure and pulse will normalize. Mom will monitor We will for recurrence of attention disorder symptoms. Follow-up: Return in about 1 month (around 03/10/2017).  Trevor Lee, M.D.

## 2017-02-24 ENCOUNTER — Telehealth: Payer: Self-pay | Admitting: Family Medicine

## 2017-02-24 NOTE — Telephone Encounter (Signed)
Pt needs refill on Vyvanse, was advised he ntbs. Appt given Friday 02/26/17 at 4:10.

## 2017-02-26 ENCOUNTER — Ambulatory Visit (INDEPENDENT_AMBULATORY_CARE_PROVIDER_SITE_OTHER): Payer: 59 | Admitting: Family Medicine

## 2017-02-26 ENCOUNTER — Encounter: Payer: Self-pay | Admitting: Family Medicine

## 2017-02-26 VITALS — BP 119/66 | HR 88 | Temp 97.5°F | Ht 67.0 in | Wt 196.0 lb

## 2017-02-26 DIAGNOSIS — F422 Mixed obsessional thoughts and acts: Secondary | ICD-10-CM

## 2017-02-26 DIAGNOSIS — F902 Attention-deficit hyperactivity disorder, combined type: Secondary | ICD-10-CM | POA: Diagnosis not present

## 2017-02-26 MED ORDER — LISDEXAMFETAMINE DIMESYLATE 50 MG PO CAPS: 50.0000 mg | ORAL_CAPSULE | ORAL | 0 refills | Status: DC

## 2017-02-26 NOTE — Progress Notes (Signed)
Subjective:  Patient ID: LEIAM HOPWOOD, male    DOB: 11/09/1990  Age: 26 y.o. MRN: [id number]  CC: ADHD (pt here today to discuss ADHD and get refills)   HPI KALMEN LOLLAR presents for Follow-up on his ADD meds. We reduced his Vyvanse to 60 mg and mom says he did quite well. He actually seemed to be more communicative and more responsive on the reduced dose. He is leaving for [facility] tomorrow and when he returns a like to go ahead with the next reduction.   History Jerrik has a past medical history of Autistic disorder and Seizures ([place]).   He has a past surgical history that includes Tendon transfer elbow (Left, December 2013).   His family history is not on file.He reports that he has never smoked. He has never used smokeless tobacco. He reports that he does not drink alcohol or use drugs.    ROS Review of Systems Negative except as per history of present illness Objective:  BP 119/66   Pulse 88   Temp 97.5 F (36.4 C) (Oral)   Ht 5\' 7"  (1.702 m)   Wt 196 lb (88.9 kg)   BMI 30.70 kg/m   BP Readings from Last 3 Encounters:  02/26/17 119/66  02/08/17 134/76  01/25/17 134/85    Wt Readings from Last 3 Encounters:  02/26/17 196 lb (88.9 kg)  02/08/17 199 lb (90.3 kg)  01/25/17 201 lb 12.8 oz (91.5 kg)     Physical Exam  Constitutional: He is oriented to person, place, and time. He appears well-developed and well-nourished.  HENT:  Head: Normocephalic and atraumatic.  Right Ear: External ear normal.  Left Ear: External ear normal.  Mouth/Throat: No oropharyngeal exudate or posterior oropharyngeal erythema.  Eyes: Pupils are equal, round, and reactive to light.  Neck: Normal range of motion. Neck supple.  Cardiovascular: Normal rate and regular rhythm.   No murmur heard. Pulmonary/Chest: Breath sounds normal. No respiratory distress.  Neurological: He is alert and oriented to person, place, and time.  Vitals reviewed.     Assessment & Plan:    Ah was seen today for adhd.  Diagnoses and all orders for this visit:  Attention deficit hyperactivity disorder (ADHD), combined type  Mixed obsessional thoughts and acts  Other orders -     lisdexamfetamine (VYVANSE) 50 MG capsule; Take 1 capsule (50 mg total) by mouth every morning.       I have changed Mr. Willner lisdexamfetamine. I am also having him maintain his INTUNIV, CLINPRO 5000, divalproex, and TEGRETOL-XR.  Allergies as of 02/26/2017   No Known Allergies     Medication List       Accurate as of 02/26/17  5:10 PM. Always use your most recent med list.          CLINPRO 5000 1.1 % Pste Generic drug:  Sodium Fluoride APPLY PEA SIZE AMOUNT TO TOOTHBRUSH. FOR 2 MINUTES, RINSE AND SPIT.   divalproex 500 MG DR tablet Commonly known as:  DEPAKOTE TAKE 1 TABLET BY MOUTH IN THE MORNING, TAKE 1 TABLET AT MID-DAY, AND 2 TABLETS AT BEDTIME   INTUNIV 4 MG Tb24 ER tablet Generic drug:  guanFACINE   lisdexamfetamine 50 MG capsule Commonly known as:  VYVANSE Take 1 capsule (50 mg total) by mouth every morning.   TEGRETOL-XR 100 MG 12 hr tablet Generic drug:  carbamazepine TAKE 2 TABLETS BY MOUTH IN THE MORNING TAKE 1 TABLET AT NOON TAKE 2 TABLETS AT BEDTIME  Continue meds as noted above with the only change being in the Vyvanse  Follow-up: Return in about 1 month (around 03/28/2017).  Claretta Fraise, M.D.

## 2017-03-16 DIAGNOSIS — L7211 Pilar cyst: Secondary | ICD-10-CM | POA: Diagnosis not present

## 2017-03-22 ENCOUNTER — Ambulatory Visit: Payer: 59 | Admitting: Family Medicine

## 2017-03-25 DIAGNOSIS — D234 Other benign neoplasm of skin of scalp and neck: Secondary | ICD-10-CM | POA: Diagnosis not present

## 2017-03-29 ENCOUNTER — Ambulatory Visit (INDEPENDENT_AMBULATORY_CARE_PROVIDER_SITE_OTHER): Payer: 59 | Admitting: Family Medicine

## 2017-03-29 ENCOUNTER — Encounter: Payer: Self-pay | Admitting: Family Medicine

## 2017-03-29 VITALS — BP 101/70 | HR 84 | Temp 97.2°F | Ht 67.0 in | Wt 199.0 lb

## 2017-03-29 DIAGNOSIS — G808 Other cerebral palsy: Secondary | ICD-10-CM | POA: Diagnosis not present

## 2017-03-29 DIAGNOSIS — F902 Attention-deficit hyperactivity disorder, combined type: Secondary | ICD-10-CM

## 2017-03-29 MED ORDER — LISDEXAMFETAMINE DIMESYLATE 40 MG PO CAPS: 40.0000 mg | ORAL_CAPSULE | ORAL | 0 refills | Status: DC

## 2017-03-29 NOTE — Progress Notes (Signed)
Subjective:  Patient ID: Trevor Lee, male    DOB: Nov 12, 1990  Age: 26 y.o. MRN: [id number]  CC: ADHD (pt here today to follow up on his ADHD and wants to continue to decrease his medication dose.)   HPI Trevor Lee presents for Doing well with focus except he is having a little trouble with impulse control and that he was at the gym and zoster 3 girls would not be dissuaded from going to say hi. He did nothing offensive or aggressive in any way. Mom says that he is doing well at work and in spite of the decrease medicine there has been no swelling. However she has not told them so that at this time that is a blinded aspect of this evaluation.  Depression screen [facility] 2/9 03/29/2017 02/26/2017 02/08/2017  Decreased Interest 0 0 0  Down, Depressed, Hopeless 0 0 0  PHQ - 2 Score 0 0 0    History Trevor Lee has a past medical history of Autistic disorder and Seizures ([place]).   He has a past surgical history that includes Tendon transfer elbow (Left, December 2013).   His family history is not on file.He reports that he has never smoked. He has never used smokeless tobacco. He reports that he does not drink alcohol or use drugs.    ROS Review of Systems  Constitutional: Negative for chills, diaphoresis and fever.  HENT: Negative for rhinorrhea and sore throat.   Respiratory: Negative for cough and shortness of breath.   Cardiovascular: Negative for chest pain.  Gastrointestinal: Negative for abdominal pain.  Musculoskeletal: Negative for arthralgias and myalgias.  Skin: Negative for rash.  Neurological: Negative for weakness and headaches.    Objective:  BP 101/70   Pulse 84   Temp (!) 97.2 F (36.2 C) (Oral)   Ht 5\' 7"  (1.702 m)   Wt 199 lb (90.3 kg)   BMI 31.17 kg/m   BP Readings from Last 3 Encounters:  03/29/17 101/70  02/26/17 119/66  02/08/17 134/76    Wt Readings from Last 3 Encounters:  03/29/17 199 lb (90.3 kg)  02/26/17 196 lb (88.9 kg)  02/08/17 199  lb (90.3 kg)     Physical Exam  Constitutional: He appears well-developed and well-nourished.  HENT:  Head: Normocephalic and atraumatic.  Right Ear: Tympanic membrane and external ear normal. No decreased hearing is noted.  Left Ear: Tympanic membrane and external ear normal. No decreased hearing is noted.  Mouth/Throat: No oropharyngeal exudate or posterior oropharyngeal erythema.  Eyes: Pupils are equal, round, and reactive to light.  Neck: Normal range of motion. Neck supple.  Cardiovascular: Normal rate and regular rhythm.   No murmur heard. Pulmonary/Chest: Breath sounds normal. No respiratory distress.  Abdominal: Soft. Bowel sounds are normal. He exhibits no mass. There is no tenderness.  Vitals reviewed.     Assessment & Plan:   Trevor Lee was seen today for adhd.  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  Continue to decrease the medicine. Very minimal symptoms so will go ahead with the reduction noted above. They have some of the 50 mg tablets left. Therefore they will finish those over the next couple weeks and then decrease to 40 and he will follow-up here in about 6 weeks     I  have changed Mr. Trevor Lee lisdexamfetamine. I am also having him maintain his INTUNIV, CLINPRO 5000, divalproex, and TEGRETOL-XR.  Allergies as of 03/29/2017   No Known Allergies     Medication List       Accurate as of 03/29/17  5:26 PM. Always use your most recent med list.          CLINPRO 5000 1.1 % Pste Generic drug:  Sodium Fluoride APPLY PEA SIZE AMOUNT TO TOOTHBRUSH. FOR 2 MINUTES, RINSE AND SPIT.   divalproex 500 MG DR tablet Commonly known as:  DEPAKOTE TAKE 1 TABLET BY MOUTH IN THE MORNING, TAKE 1 TABLET AT MID-DAY, AND 2 TABLETS AT BEDTIME   INTUNIV 4 MG  Tb24 ER tablet Generic drug:  guanFACINE   lisdexamfetamine 40 MG capsule Commonly known as:  VYVANSE Take 1 capsule (40 mg total) by mouth every morning.   TEGRETOL-XR 100 MG 12 hr tablet Generic drug:  carbamazepine TAKE 2 TABLETS BY MOUTH IN THE MORNING TAKE 1 TABLET AT NOON TAKE 2 TABLETS AT BEDTIME        Follow-up: Return in about 6 weeks (around 05/10/2017).  Claretta Fraise, M.D.

## 2017-05-11 ENCOUNTER — Encounter: Payer: Self-pay | Admitting: Family Medicine

## 2017-05-11 ENCOUNTER — Ambulatory Visit (INDEPENDENT_AMBULATORY_CARE_PROVIDER_SITE_OTHER): Payer: 59 | Admitting: Family Medicine

## 2017-05-11 VITALS — BP 120/74 | HR 69 | Temp 98.5°F | Ht 67.0 in | Wt 198.0 lb

## 2017-05-11 DIAGNOSIS — F422 Mixed obsessional thoughts and acts: Secondary | ICD-10-CM

## 2017-05-11 DIAGNOSIS — F902 Attention-deficit hyperactivity disorder, combined type: Secondary | ICD-10-CM

## 2017-05-11 MED ORDER — LISDEXAMFETAMINE DIMESYLATE 30 MG PO CAPS: 30.0000 mg | ORAL_CAPSULE | Freq: Every day | ORAL | 0 refills | Status: DC

## 2017-05-11 NOTE — Progress Notes (Signed)
Subjective:  Patient ID: Trevor Lee, male    DOB: 1991-05-10  Age: 26 y.o. MRN: [id number]  CC: ADHD (pt here today for follow up of his ADHD and is doing good on his decreased dose)   HPI HOMERO HYSON presents for recheck of his adhd. Doin well at work. No complaints from supervisors. Sleeping well. Eating well. Mom satisfied with behavior although he can get a bit verbally aggressive. Never physical. Had one episode with a teacher last week with being told to put a necklace in his pocket when he wanted to put it in the trash. He was resistant and tried to step on her feet later.  Depression screen [facility] 2/9 03/29/2017 02/26/2017 02/08/2017  Decreased Interest 0 0 0  Down, Depressed, Hopeless 0 0 0  PHQ - 2 Score 0 0 0    History Laythan has a past medical history of Autistic disorder and Seizures ([place]).   He has a past surgical history that includes Tendon transfer elbow (Left, December 2013).   His family history is not on file.He reports that he has never smoked. He has never used smokeless tobacco. He reports that he does not drink alcohol or use drugs.    ROS Review of Systems Noncontributory except as per HPI Objective:  BP 120/74   Pulse 69   Temp 98.5 F (36.9 C) (Oral)   Ht 5\' 7"  (1.702 m)   Wt 198 lb (89.8 kg)   BMI 31.01 kg/m   BP Readings from Last 3 Encounters:  05/11/17 120/74  03/29/17 101/70  02/26/17 119/66    Wt Readings from Last 3 Encounters:  05/11/17 198 lb (89.8 kg)  03/29/17 199 lb (90.3 kg)  02/26/17 196 lb (88.9 kg)     Physical Exam  Constitutional: He appears well-developed and well-nourished.  HENT:  Head: Normocephalic and atraumatic.  Right Ear: Tympanic membrane and external ear normal. No decreased hearing is noted.  Left Ear: Tympanic membrane and external ear normal. No decreased hearing is noted.  Mouth/Throat: No oropharyngeal exudate or posterior oropharyngeal erythema.  Eyes: Pupils are equal, round, and reactive  to light.  Neck: Normal range of motion. Neck supple.  Cardiovascular: Normal rate and regular rhythm.   No murmur heard. Pulmonary/Chest: Breath sounds normal. No respiratory distress.  Abdominal: Soft. Bowel sounds are normal. He exhibits no mass. There is no tenderness.  Psychiatric: He has a normal mood and affect. His behavior is normal.  Vitals reviewed.     Assessment & Plan:   Colton was seen today for adhd.  Diagnoses and all orders for this visit:  Attention deficit hyperactivity disorder (ADHD), combined type  Mixed obsessional thoughts and acts  Other orders -     lisdexamfetamine (VYVANSE) 30 MG capsule; Take 1 capsule (30 mg total) by mouth daily.       I have discontinued Mr. Cardinal lisdexamfetamine. I am also having him start on lisdexamfetamine. Additionally, I am having him maintain his INTUNIV, CLINPRO 5000, divalproex, and TEGRETOL-XR.  Allergies as of 05/11/2017   No Known Allergies     Medication List       Accurate as of 05/11/17  5:34 PM. Always use your most recent med list.          CLINPRO 5000 1.1 % Pste Generic drug:  Sodium Fluoride APPLY PEA SIZE AMOUNT TO TOOTHBRUSH. FOR 2 MINUTES, RINSE AND SPIT.   divalproex 500 MG DR tablet Commonly known as:  DEPAKOTE TAKE 1 TABLET  BY MOUTH IN THE MORNING, TAKE 1 TABLET AT MID-DAY, AND 2 TABLETS AT BEDTIME   INTUNIV 4 MG Tb24 ER tablet Generic drug:  guanFACINE   lisdexamfetamine 30 MG capsule Commonly known as:  VYVANSE Take 1 capsule (30 mg total) by mouth daily.   TEGRETOL-XR 100 MG 12 hr tablet Generic drug:  carbamazepine TAKE 2 TABLETS BY MOUTH IN THE MORNING TAKE 1 TABLET AT NOON TAKE 2 TABLETS AT BEDTIME            Discharge Care Instructions        Start     Ordered   05/11/17 0000  lisdexamfetamine (VYVANSE) 30 MG capsule  Daily     05/11/17 1656       Follow-up: Return in about 1 month (around 06/10/2017).  Claretta Fraise, M.D.

## 2017-06-10 ENCOUNTER — Ambulatory Visit (INDEPENDENT_AMBULATORY_CARE_PROVIDER_SITE_OTHER): Payer: 59 | Admitting: Family Medicine

## 2017-06-10 ENCOUNTER — Encounter: Payer: Self-pay | Admitting: Family Medicine

## 2017-06-10 VITALS — BP 131/79 | HR 83 | Temp 97.3°F | Ht 67.0 in | Wt 196.0 lb

## 2017-06-10 DIAGNOSIS — Z79899 Other long term (current) drug therapy: Secondary | ICD-10-CM | POA: Diagnosis not present

## 2017-06-10 DIAGNOSIS — Z23 Encounter for immunization: Secondary | ICD-10-CM | POA: Diagnosis not present

## 2017-06-10 DIAGNOSIS — G808 Other cerebral palsy: Secondary | ICD-10-CM | POA: Diagnosis not present

## 2017-06-10 DIAGNOSIS — F902 Attention-deficit hyperactivity disorder, combined type: Secondary | ICD-10-CM | POA: Diagnosis not present

## 2017-06-10 MED ORDER — LISDEXAMFETAMINE DIMESYLATE 20 MG PO CAPS: 20.0000 mg | ORAL_CAPSULE | Freq: Every day | ORAL | 0 refills | Status: DC

## 2017-06-10 NOTE — Progress Notes (Signed)
Subjective:  Patient ID: Trevor Lee, male    DOB: 1991-01-21  Age: 26 y.o. MRN: [id number]  CC: ADHD (pt here today following up on his ADHD)   HPI Trevor Lee presents for Recheck of his ADD since this can decreasing the dose of Vyvanse to 30 mg a day. Mom says his behaviors been good. No complaints from work or school. He in fact went on a field trip to the [place] and did well. Additionally he is planning a trip with his school to the [facility] in [place]. That'll be a two night trip. There have been no side effects. Mom says he can be defiant  at times but it's limited to spells lasting about 5 min. after which he will come back and apologize.  Depression screen [facility] 2/9 03/29/2017 02/26/2017 02/08/2017  Decreased Interest 0 0 0  Down, Depressed, Hopeless 0 0 0  PHQ - 2 Score 0 0 0    History Trevor Lee has a past medical history of Autistic disorder and Seizures ([place]).   He has a past surgical history that includes Tendon transfer elbow (Left, December 2013).   His family history is not on file.He reports that he has never smoked. He has never used smokeless tobacco. He reports that he does not drink alcohol or use drugs.    ROS Review of Systems  Constitutional: Negative for chills, diaphoresis and fever.  HENT: Negative for rhinorrhea and sore throat.   Respiratory: Negative for cough and shortness of breath.   Cardiovascular: Negative for chest pain.  Gastrointestinal: Negative for abdominal pain.  Musculoskeletal: Negative for arthralgias and myalgias.  Skin: Negative for rash.  Neurological: Negative for weakness and headaches.    Objective:  BP 131/79   Pulse 83   Temp (!) 97.3 F (36.3 C) (Oral)   Ht 5\' 7"  (1.702 m)   Wt 196 lb (88.9 kg)   BMI 30.70 kg/m   BP Readings from Last 3 Encounters:  06/10/17 131/79  05/11/17 120/74  03/29/17 101/70    Wt Readings from Last 3 Encounters:  06/10/17 196 lb (88.9 kg)    05/11/17 198 lb (89.8 kg)  03/29/17 199 lb (90.3 kg)     Physical Exam  Constitutional: He is oriented to person, place, and time. He appears well-developed and well-nourished.  HENT:  Head: Normocephalic and atraumatic.  Right Ear: External ear normal.  Left Ear: External ear normal.  Mouth/Throat: No oropharyngeal exudate or posterior oropharyngeal erythema.  Eyes: Pupils are equal, round, and reactive to light.  Neck: Normal range of motion. Neck supple.  Cardiovascular: Normal rate and regular rhythm.   No murmur heard. Pulmonary/Chest: Breath sounds normal. No respiratory distress.  Musculoskeletal:  Limited use of LUE due to congenital defect  Neurological: He is alert and oriented to person, place, and time.  Skin: Skin is warm and dry.  Psychiatric: He has a normal mood and affect. His behavior is normal.  Vitals reviewed.     Assessment & Plan:   Trevor Lee was seen today for adhd.  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       I have discontinued Trevor Lee lisdexamfetamine. I am also having him start on lisdexamfetamine. Additionally, I  am having him maintain his INTUNIV, CLINPRO 5000, divalproex, and TEGRETOL-XR.  Allergies as of 06/10/2017   No Known Allergies     Medication List       Accurate as of 06/10/17  5:01 PM. Always use your most recent med list.          CLINPRO 5000 1.1 % Pste Generic drug:  Sodium Fluoride APPLY PEA SIZE AMOUNT TO TOOTHBRUSH. FOR 2 MINUTES, RINSE AND SPIT.   divalproex 500 MG DR tablet Commonly known as:  DEPAKOTE TAKE 1 TABLET BY MOUTH IN THE MORNING, TAKE 1 TABLET AT MID-DAY, AND 2 TABLETS AT BEDTIME   INTUNIV 4 MG Tb24 ER tablet Generic drug:  guanFACINE    lisdexamfetamine 20 MG capsule Commonly known as:  VYVANSE Take 1 capsule (20 mg total) by mouth daily.   TEGRETOL-XR 100 MG 12 hr tablet Generic drug:  carbamazepine TAKE 2 TABLETS BY MOUTH IN THE MORNING TAKE 1 TABLET AT NOON TAKE 2 TABLETS AT BEDTIME        Follow-up: Return in about 1 month (around 07/11/2017).  Claretta Fraise, M.D.

## 2017-06-15 ENCOUNTER — Other Ambulatory Visit: Payer: Self-pay | Admitting: Family

## 2017-06-15 DIAGNOSIS — G40119 Localization-related (focal) (partial) symptomatic epilepsy and epileptic syndromes with simple partial seizures, intractable, without status epilepticus: Secondary | ICD-10-CM

## 2017-06-15 DIAGNOSIS — G40109 Localization-related (focal) (partial) symptomatic epilepsy and epileptic syndromes with simple partial seizures, not intractable, without status epilepticus: Secondary | ICD-10-CM

## 2017-06-15 NOTE — Telephone Encounter (Signed)
I called Trevor Lee's Mom, Trevor Lee and left a message letting her know that he needs a follow up appointment. TG

## 2017-06-15 NOTE — Telephone Encounter (Signed)
Hi Trevor Lee this patient has not been seen since August 2017. How would you like to handle this?

## 2017-06-23 ENCOUNTER — Ambulatory Visit (INDEPENDENT_AMBULATORY_CARE_PROVIDER_SITE_OTHER): Payer: 59 | Admitting: Family

## 2017-06-23 ENCOUNTER — Encounter (INDEPENDENT_AMBULATORY_CARE_PROVIDER_SITE_OTHER): Payer: Self-pay | Admitting: Family

## 2017-06-23 VITALS — BP 130/80 | HR 80 | Ht 67.0 in | Wt 200.4 lb

## 2017-06-23 DIAGNOSIS — G8114 Spastic hemiplegia affecting left nondominant side: Secondary | ICD-10-CM | POA: Diagnosis not present

## 2017-06-23 DIAGNOSIS — G808 Other cerebral palsy: Secondary | ICD-10-CM | POA: Diagnosis not present

## 2017-06-23 DIAGNOSIS — F422 Mixed obsessional thoughts and acts: Secondary | ICD-10-CM | POA: Diagnosis not present

## 2017-06-23 DIAGNOSIS — G40109 Localization-related (focal) (partial) symptomatic epilepsy and epileptic syndromes with simple partial seizures, not intractable, without status epilepticus: Secondary | ICD-10-CM | POA: Diagnosis not present

## 2017-06-23 DIAGNOSIS — R269 Unspecified abnormalities of gait and mobility: Secondary | ICD-10-CM

## 2017-06-23 DIAGNOSIS — F79 Unspecified intellectual disabilities: Secondary | ICD-10-CM

## 2017-06-23 DIAGNOSIS — G40409 Other generalized epilepsy and epileptic syndromes, not intractable, without status epilepticus: Secondary | ICD-10-CM

## 2017-06-23 DIAGNOSIS — R482 Apraxia: Secondary | ICD-10-CM | POA: Diagnosis not present

## 2017-06-23 DIAGNOSIS — R4689 Other symptoms and signs involving appearance and behavior: Secondary | ICD-10-CM | POA: Insufficient documentation

## 2017-06-23 DIAGNOSIS — F802 Mixed receptive-expressive language disorder: Secondary | ICD-10-CM | POA: Diagnosis not present

## 2017-06-23 HISTORY — DX: Other symptoms and signs involving appearance and behavior: R46.89

## 2017-06-23 NOTE — Progress Notes (Signed)
Patient: Trevor Lee MRN: [id number] Sex: male DOB: 09/01/1991  Provider: Rockwell Germany, NP Location of Care: [facility]  Note type: Routine return visit  History of Present Illness: History from: mother, patient and [facility] chart Chief Complaint: Seizures  Trevor Lee is a 26 y.o. 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 He was last seen April 08, 2016. Trevor Lee is taking and tolerating Tegretol XR and generic Divalproex for his seizure disorder and has remained seizure free since September 2016. He lives at home with his mother and goes to a day program 3 days per week. 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 Trevor Lee also goes to [employer] and works out with a trainer 3 times per week. This has been beneficial as Trevor Lee is obese and has problems with hypertension.   Trevor Lee has had problems with mood in the past and was tried on Abilify but he gained considerable weight. He was taking Vyvanse to help focus his attention, but Mom says that is being weaned because he has had some problems with tachycardia. Mom cannot tell any difference in his focus or his behavior since the medication has been weaned. Mom's concern today is that over the last several months, Trevor Lee's obsessive behaviors have worsened, as well his his reaction when someone attempts to intervene with the behavior. For example he carries a phone and ear phones with him and attempts to get him to put them aside for meals, interactions with others, activities etc is met with aggressive resistance. Trevor Lee does not typically have the phone on or have the ear phones in his ears but he insists that he have the  phone in his hand and the ear phones looped around his neck and attached to the phone.   Another obsessive behavior that is troublesome is hugging others. Trevor Lee likes to hug people and some people have complained to his mother about repetitive hugging, and Trevor Lee's reaction if the hug is avoided, or if the person only agrees to one hug. Mom says that it does not seem to be a sexual overture but more of an obsessive behavior, particularly to hug more than once.   Mom is also concerned about an increasing aggressive behavior that Trevor Lee has shown when he doesn't like the outcome of something, or is not allowed to do something that he wants to do. He has struck out at people, such as his mother and caregivers. Mom said that it is more of an open handed swat, not a fisted hit, but that as Trevor Lee is a large young man, she is concerned about him hitting and hurting someone, as well as being expelled from his day program, his work, [employer] or getting into legal trouble if he would hit a stranger. Mom asked about a referral for a neuropsychological evaluation now that Trevor Lee is older.   Trevor Lee has been otherwise generally healthy since he was last seen. His mother has no other health concerns today other than previously mentioned.   Review of Systems: Please see the HPI for neurologic and other pertinent review of systems. Otherwise, all other systems were reviewed and were negative.    Past Medical History:  Diagnosis Date  . Autistic disorder   .  Seizures ([place])    Hospitalizations: No., Head Injury: No., Nervous System Infections: No., Immunizations up to date: Yes.   Past Medical History Comments: See history   Surgical History Past Surgical History:  Procedure Laterality Date  . TENDON TRANSFER ELBOW Left December 2013    Family History family history is not on file. Family History is otherwise negative for migraines, seizures, cognitive impairment, blindness, deafness, birth defects, chromosomal  disorder, autism.  Social History Social History   Social History  . Marital status: Single    Spouse name: N/A  . Number of children: N/A  . Years of education: N/A   Social History Main Topics  . Smoking status: Never Smoker  . Smokeless tobacco: Never Used  . Alcohol use No  . Drug use: No  . Sexual activity: No   Other Topics Concern  . None   Social History Narrative   He graduated from [employer] where he was in a self contained class setting. He has had difficulty with attention span, and difficulty keeping up with the pace of regular classes. Trevor Lee is now in enrolled in a day program 5 days per week. He enjoys riding his bike, [employer] basketball, listening to music, computer and playing music. He is employed at the [employer] where he does [employer].   Allergies No Known Allergies  Physical Exam BP 130/80   Pulse 80   Ht _0  (1.702 m)   Wt 200 lb 6.4 oz (90.9 kg)   BMI 31.39 kg/m  General: well developed, well nourished obese young man, seated in exam room, brown hair, brown eyes, in no evident distress Head: normocephalic and atraumatic. Oropharynx benign. No dysmorphic features. Neck: supple with no carotid bruits. No focal tenderness. Cardiovascular: regular rate and rhythm, no murmurs. Respiratory: Clear to auscultation bilaterally Abdomen: Bowel sounds present all four quadrants. Musculoskeletal: Spastic contractures of the left arm and leg with left wrist drop, dystonic posturing of the fingers with clawhand deformity, decreased range of motion at the left elbow, tight left heel cord, and increased tone in the left body and leg. Wears left AFO Skin: no rashes or neurocutaneous lesions  Neurologic Exam Mental Status: Awake and fully alert.  Attention span, concentration, and fund of knowledge below normal for age.  Speech with dysarthria.  Able to follow simple commands. Was silly at times and needed frequent redirection. When his mother  relayed her concerns about his behavior, he told her "I will do better", then he told me "this is my Mom". Refused to put his phone on the exam table beside him during the examination. Cranial Nerves: Fundoscopic exam - red reflex present.  Unable to fully visualize fundus.  Pupils equal briskly reactive to light.  Extraocular movements full without nystagmus.  Visual fields appeared full to confrontation but he had difficulty following directions for that  Turned to localize sounds.  Facial sensation intact.  Face, tongue, palate move normally and symmetrically.  Neck flexion and extension normal. Motor: Spastic left hemiparesis with mild to moderate weakness on the left. Significant clumsiness with fine motor movements in the left hand. Normal strength and fine motor movements on the right.  Sensory: Withdrawal x4 Coordination: Normal on the right, poor on the left.  Gait and Station: Left hemiparetic gait but actually walks fairly well with the AFO. The left arm is semi-flexed and slightly adducted when he walks. He could not understand instructions for Romberg testing. Reflexes: Left reflex predominance, left extensor plantar response  Impression 1.  Localization related epilepsy 2. Congenital left hemiparesis with left hemiatrophy 3. Moderate mixed receptive and expressive language disorder 4. Dyspraxia 5. Dysgraphia 6. Attention deficit disorder 7. Obsessive compulsive behavior 8. Aggressive behavior   Recommendations for plan of care The patient's previous [facility] records were reviewed. Trevor Lee has neither had nor required imaging or lab studies since the last visit. 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 He is taking and tolerating  Tegretol XR and generic Divalproex and has remained seizure free since September 2016. He has been generally healthy and doing well but has had been having increasing problems with obsessive and aggressive behavior. His mother asked for a referral to Dr Ane Payment in [facility] for a neurpsychological evaluation, and I am happy to provide that. I will be in touch with Mom when I receive the results. Trevor Lee will continue on his medications without change for now. I will otherwise see him back in 1 year or sooner if needed for seizure follow up. Mom agreed with the plans made today.  The medication list was reviewed and reconciled.  No changes were made in the prescribed medications today.  A complete medication list was provided to the patient's mother.   Allergies as of 06/23/2017   No Known Allergies     Medication List       Accurate as of 06/23/17  6:20 PM. Always use your most recent med list.          divalproex 500 MG DR tablet Commonly known as:  DEPAKOTE TAKE 1 TABLET BY MOUTH IN THE MORNING, TAKE 1 TABLET AT MID-DAY, AND 2 TABLETS AT BEDTIME   INTUNIV 4 MG Tb24 ER tablet Generic drug:  guanFACINE   TEGRETOL-XR 100 MG 12 hr tablet Generic drug:  carbamazepine TAKE 2 TABLETS BY MOUTH IN THE MORNING, 1 TAB AT NOON, AND 2 TABS AT BEDTIME   VYVANSE 30 MG capsule Generic drug:  lisdexamfetamine Take 30 mg by mouth daily.       Total time spent with the patient was 30 minutes, of which 50% or more was spent in counseling and coordination of care.   Rockwell Germany NP-C

## 2017-06-23 NOTE — Patient Instructions (Signed)
Thank you for coming in today.   Instructions for you until your next appointment are as follows: 1. Continue [name]'s Tegretol XR and Divalproex as you have been giving it.  2. Let me know if he has any seizures 3. I will refer him to Dr Ane Payment in [facility] as we discussed. When I receive her report, I will contact you to review it together.  4. Please sign up for MyChart if you have not done so 5. Please plan to return for follow up in one year or sooner if needed for seizure follow up.

## 2017-07-01 ENCOUNTER — Other Ambulatory Visit: Payer: Self-pay | Admitting: Family

## 2017-07-08 ENCOUNTER — Ambulatory Visit (INDEPENDENT_AMBULATORY_CARE_PROVIDER_SITE_OTHER): Payer: 59 | Admitting: Family Medicine

## 2017-07-08 ENCOUNTER — Encounter: Payer: Self-pay | Admitting: Family Medicine

## 2017-07-08 VITALS — BP 129/72 | HR 74 | Temp 96.6°F | Ht 67.0 in | Wt 200.0 lb

## 2017-07-08 DIAGNOSIS — F802 Mixed receptive-expressive language disorder: Secondary | ICD-10-CM | POA: Diagnosis not present

## 2017-07-08 DIAGNOSIS — R4689 Other symptoms and signs involving appearance and behavior: Secondary | ICD-10-CM

## 2017-07-08 DIAGNOSIS — F902 Attention-deficit hyperactivity disorder, combined type: Secondary | ICD-10-CM

## 2017-07-08 MED ORDER — LISDEXAMFETAMINE DIMESYLATE 10 MG PO CAPS: 10.0000 mg | ORAL_CAPSULE | Freq: Every day | ORAL | 0 refills | Status: DC

## 2017-07-11 ENCOUNTER — Encounter: Payer: Self-pay | Admitting: Family Medicine

## 2017-07-11 NOTE — Progress Notes (Signed)
Subjective:  Patient ID: Trevor Lee, male    DOB: 19-Feb-1991  Age: 26 y.o. MRN: [id number]  CC: ADHD (pt here today for ADHD)   HPI [name] presents for continuation of his taper off of Vyvanse. He has een somewhat more aggressive this month. Had an incident with his shadow - not following directions. However, mom has found a neuropsychologist & psychiatrist in [place] to work with [name]. Testing is set for later this month.   Depression screen [facility] 2/9 07/08/2017 03/29/2017 02/26/2017  Decreased Interest 0 0 0  Down, Depressed, Hopeless 0 0 0  PHQ - 2 Score 0 0 0    History Ilay has a past medical history of Autistic disorder and Seizures ([place]).   He has a past surgical history that includes Tendon transfer elbow (Left, December 2013).   His family history is not on file.He reports that  has never smoked. he has never used smokeless tobacco. He reports that he does not drink alcohol or use drugs.    ROS Review of Systems  Constitutional: Negative for chills, diaphoresis and fever.  HENT: Negative for rhinorrhea and sore throat.   Respiratory: Negative for cough and shortness of breath.   Cardiovascular: Negative for chest pain.  Gastrointestinal: Negative for abdominal pain.  Musculoskeletal: Negative for arthralgias and myalgias.  Skin: Negative for rash.  Neurological: Negative for weakness and headaches.    Objective:  BP 129/72   Pulse 74   Temp (!) 96.6 F (35.9 C) (Oral)   Ht 5\' 7"  (1.702 m)   Wt 200 lb (90.7 kg)   BMI 31.32 kg/m   BP Readings from Last 3 Encounters:  07/08/17 129/72  06/23/17 130/80  06/10/17 131/79    Wt Readings from Last 3 Encounters:  07/08/17 200 lb (90.7 kg)  06/23/17 200 lb 6.4 oz (90.9 kg)  06/10/17 196 lb (88.9 kg)     Physical Exam  Constitutional: He is oriented to person, place, and time. He appears well-developed and well-nourished.  HENT:  Head: Normocephalic and atraumatic.  Right Ear: External ear  normal.  Left Ear: External ear normal.  Mouth/Throat: No oropharyngeal exudate or posterior oropharyngeal erythema.  Eyes: Pupils are equal, round, and reactive to light.  Neck: Normal range of motion. Neck supple.  Cardiovascular: Normal rate and regular rhythm.  No murmur heard. Pulmonary/Chest: Breath sounds normal. No respiratory distress.  Neurological: He is alert and oriented to person, place, and time.  Vitals reviewed.     Assessment & Plan:   Jameek was seen today for adhd.  Diagnoses and all orders for this visit:  Mixed receptive-expressive language disorder  Aggressive behavior  Attention deficit hyperactivity disorder (ADHD), combined type  Other orders -     lisdexamfetamine (VYVANSE) 10 MG capsule; Take 1 capsule (10 mg total) by mouth daily.         Allergies as of 07/08/2017   No Known Allergies     Medication List        Accurate as of 07/08/17 11:59 PM. Always use your most recent med list.          divalproex 500 MG DR tablet Commonly known as:  DEPAKOTE TAKE 1 TABLET BY MOUTH IN THE MORNING, TAKE 1 TABLET AT MID-DAY, AND 2 TABLETS AT BEDTIME   INTUNIV 4 MG Tb24 ER tablet Generic drug:  guanFACINE   lisdexamfetamine 10 MG capsule Commonly known as:  VYVANSE Take 1 capsule (10 mg total) by mouth daily.  TEGRETOL-XR 100 MG 12 hr tablet Generic drug:  carbamazepine TAKE 2 TABLETS BY MOUTH IN THE MORNING, 1 TAB AT NOON, AND 2 TABS AT BEDTIME        Follow-up: Return in about 1 month (around 08/07/2017).  Claretta Fraise, M.D.

## 2017-08-05 ENCOUNTER — Ambulatory Visit: Payer: 59 | Admitting: Family Medicine

## 2017-08-05 ENCOUNTER — Encounter: Payer: Self-pay | Admitting: Family Medicine

## 2017-08-05 VITALS — BP 112/75 | HR 83 | Temp 97.1°F | Ht 67.0 in | Wt 202.0 lb

## 2017-08-05 DIAGNOSIS — F79 Unspecified intellectual disabilities: Secondary | ICD-10-CM | POA: Diagnosis not present

## 2017-08-05 DIAGNOSIS — F902 Attention-deficit hyperactivity disorder, combined type: Secondary | ICD-10-CM | POA: Diagnosis not present

## 2017-08-05 NOTE — Progress Notes (Signed)
Subjective:  Patient ID: Trevor Lee, male    DOB: 1991/06/06  Age: 26 y.o. MRN: [id number]  CC: ADHD (pt here today following up for ADHD)   HPI NILES ESS presents for recheck of his attention deficit disorder.  He was seen earlier this week by a neuropsychiatrist, Dr. Harold Hedge.  She did a great deal of testing and feels that he does not have ADD or ADHD.  He just has a lot of global overstimulation.  She feels like that coming off of the Vyvanse will be helpful to [place].  She was unable to complete a battery of tests however due to [name]'s complex deficits.  Gait has done well this month behaviorally.  He does occasionally get verbally aggressive.  He called his mother and asked whole once this month and he made a slurry of some type to his shadow at school.  Otherwise he is actually done quite well.  He got a raise at work at the [employer] earlier this week.  Depression screen [facility] 2/9 08/05/2017 07/08/2017 03/29/2017  Decreased Interest 0 0 0  Down, Depressed, Hopeless 0 0 0  PHQ - 2 Score 0 0 0    History Nicklaus has a past medical history of Autistic disorder and Seizures ([place]).   He has a past surgical history that includes Tendon transfer elbow (Left, December 2013).   His family history is not on file.He reports that  has never smoked. he has never used smokeless tobacco. He reports that he does not drink alcohol or use drugs.    ROS Review of Systems Noncontributory Objective:  BP 112/75   Pulse 83   Temp (!) 97.1 F (36.2 C) (Oral)   Ht 5\' 7"  (1.702 m)   Wt 202 lb (91.6 kg)   BMI 31.64 kg/m   BP Readings from Last 3 Encounters:  08/05/17 112/75  07/08/17 129/72  06/23/17 130/80    Wt Readings from Last 3 Encounters:  08/05/17 202 lb (91.6 kg)  07/08/17 200 lb (90.7 kg)  06/23/17 200 lb 6.4 oz (90.9 kg)     Physical Exam  Constitutional: He appears well-developed and well-nourished.  HENT:  Head: Normocephalic and  atraumatic.  Right Ear: Tympanic membrane and external ear normal. No decreased hearing is noted.  Left Ear: Tympanic membrane and external ear normal. No decreased hearing is noted.  Mouth/Throat: No oropharyngeal exudate or posterior oropharyngeal erythema.  Eyes: Pupils are equal, round, and reactive to light.  Neck: Normal range of motion. Neck supple.  Cardiovascular: Normal rate and regular rhythm.  No murmur heard. Pulmonary/Chest: Breath sounds normal. No respiratory distress.  Abdominal: Soft. Bowel sounds are normal. He exhibits no mass. There is no tenderness.  Vitals reviewed.     Assessment & Plan:   Abdullahi was seen today for adhd.  Diagnoses and all orders for this visit:  Attention deficit hyperactivity disorder (ADHD), combined type  Intellectual disability       I have discontinued Valarie Merino C. Busbin's lisdexamfetamine. I am also having him maintain his INTUNIV, TEGRETOL-XR, and divalproex.  Allergies as of 08/05/2017   No Known Allergies     Medication List        Accurate as of 08/05/17  5:10 PM. Always use your most recent med list.          divalproex 500 MG DR tablet Commonly known as:  DEPAKOTE TAKE 1 TABLET BY MOUTH IN THE MORNING, TAKE 1 TABLET AT MID-DAY, AND 2  TABLETS AT BEDTIME   INTUNIV 4 MG Tb24 ER tablet Generic drug:  guanFACINE   TEGRETOL-XR 100 MG 12 hr tablet Generic drug:  carbamazepine TAKE 2 TABLETS BY MOUTH IN THE MORNING, 1 TAB AT NOON, AND 2 TABS AT BEDTIME        Follow-up: Return in about 6 months (around 02/02/2018).  Claretta Fraise, M.D.

## 2017-09-29 ENCOUNTER — Other Ambulatory Visit: Payer: Self-pay | Admitting: Family

## 2017-09-29 DIAGNOSIS — G40119 Localization-related (focal) (partial) symptomatic epilepsy and epileptic syndromes with simple partial seizures, intractable, without status epilepticus: Secondary | ICD-10-CM

## 2017-09-29 DIAGNOSIS — G40109 Localization-related (focal) (partial) symptomatic epilepsy and epileptic syndromes with simple partial seizures, not intractable, without status epilepticus: Secondary | ICD-10-CM

## 2017-10-01 ENCOUNTER — Telehealth (INDEPENDENT_AMBULATORY_CARE_PROVIDER_SITE_OTHER): Payer: Self-pay | Admitting: Family

## 2017-10-01 NOTE — Telephone Encounter (Signed)
Spoke with mom about this issue. I assure her that the rx was faxed over yesterday morning at 9:26 with a successful confirmation. I assure her that I would refax it a second time to ensure they receive it. She stated that she will call them to make sure they got.

## 2017-10-01 NOTE — Telephone Encounter (Signed)
°  Who's calling (name and relationship to patient) : Claiborne Billings (Mom) Best contact number: [phone] Provider they see: Rockwell Germany Reason for call: Per mom, pharmacy has sent several requests for Tegretol-XR but haven't gotten a response back. Mom just wanted to follow up. Please call mom and follow up with her.

## 2017-11-18 ENCOUNTER — Telehealth (INDEPENDENT_AMBULATORY_CARE_PROVIDER_SITE_OTHER): Payer: Self-pay | Admitting: Family

## 2017-11-18 NOTE — Telephone Encounter (Signed)
-----   Message from Rockwell Germany, NP sent at 06/15/2017  9:47 AM EDT ----- Regarding: Needs appointment [name] needs an appointment with me.   Do I still send these messages to you?   Thanks,  Otila Kluver

## 2018-02-01 DIAGNOSIS — M25572 Pain in left ankle and joints of left foot: Secondary | ICD-10-CM | POA: Diagnosis not present

## 2018-02-04 ENCOUNTER — Encounter: Payer: Self-pay | Admitting: Family Medicine

## 2018-02-04 ENCOUNTER — Ambulatory Visit (INDEPENDENT_AMBULATORY_CARE_PROVIDER_SITE_OTHER): Payer: 59 | Admitting: Family Medicine

## 2018-02-04 VITALS — BP 125/75 | HR 79 | Ht 67.0 in | Wt 205.4 lb

## 2018-02-04 DIAGNOSIS — Z0000 Encounter for general adult medical examination without abnormal findings: Secondary | ICD-10-CM | POA: Diagnosis not present

## 2018-02-04 DIAGNOSIS — G40409 Other generalized epilepsy and epileptic syndromes, not intractable, without status epilepticus: Secondary | ICD-10-CM

## 2018-02-04 DIAGNOSIS — G40109 Localization-related (focal) (partial) symptomatic epilepsy and epileptic syndromes with simple partial seizures, not intractable, without status epilepticus: Secondary | ICD-10-CM

## 2018-02-05 LAB — LIPID PANEL
CHOLESTEROL TOTAL: 169 mg/dL (ref 100–199)
Chol/HDL Ratio: 2.8 ratio (ref 0.0–5.0)
HDL: 61 mg/dL (ref >39)
LDL CALC: 83 mg/dL (ref 0–99)
Triglycerides: 125 mg/dL (ref 0–149)
VLDL CHOLESTEROL CAL: 25 mg/dL (ref 5–40)

## 2018-02-05 LAB — CBC WITH DIFFERENTIAL/PLATELET
BASOS: 1 %
Basophils Absolute: 0.1 10*3/uL (ref 0.0–0.2)
EOS (ABSOLUTE): 0.2 10*3/uL (ref 0.0–0.4)
EOS: 2 %
HEMATOCRIT: 43.4 % (ref 37.5–51.0)
HEMOGLOBIN: 15.3 g/dL (ref 13.0–17.7)
IMMATURE GRANS (ABS): 0 10*3/uL (ref 0.0–0.1)
IMMATURE GRANULOCYTES: 0 %
LYMPHS: 48 %
Lymphocytes Absolute: 4.4 10*3/uL — ABNORMAL HIGH (ref 0.7–3.1)
MCH: 32.3 pg (ref 26.6–33.0)
MCHC: 35.3 g/dL (ref 31.5–35.7)
MCV: 92 fL (ref 79–97)
MONOCYTES: 10 %
MONOS ABS: 0.8 10*3/uL (ref 0.1–0.9)
NEUTROS PCT: 39 %
Neutrophils Absolute: 3.5 10*3/uL (ref 1.4–7.0)
Platelets: 283 10*3/uL (ref 150–450)
RBC: 4.73 x10E6/uL (ref 4.14–5.80)
RDW: 12.2 % — AB (ref 12.3–15.4)
WBC: 8.9 10*3/uL (ref 3.4–10.8)

## 2018-02-05 LAB — CMP14+EGFR
A/G RATIO: 2.2 (ref 1.2–2.2)
ALT: 15 IU/L (ref 0–44)
AST: 15 IU/L (ref 0–40)
Albumin: 4.7 g/dL (ref 3.5–5.5)
Alkaline Phosphatase: 62 IU/L (ref 39–117)
BUN/Creatinine Ratio: 21 — ABNORMAL HIGH (ref 9–20)
BUN: 15 mg/dL (ref 6–20)
Bilirubin Total: <0.2 mg/dL (ref 0.0–1.2)
CALCIUM: 9.7 mg/dL (ref 8.7–10.2)
CO2: 25 mmol/L (ref 20–29)
CREATININE: 0.7 mg/dL — AB (ref 0.76–1.27)
Chloride: 102 mmol/L (ref 96–106)
GFR, EST AFRICAN AMERICAN: 153 mL/min/{1.73_m2} (ref >59)
GFR, EST NON AFRICAN AMERICAN: 132 mL/min/{1.73_m2} (ref >59)
Globulin, Total: 2.1 g/dL (ref 1.5–4.5)
Glucose: 113 mg/dL — ABNORMAL HIGH (ref 65–99)
Potassium: 3.9 mmol/L (ref 3.5–5.2)
SODIUM: 141 mmol/L (ref 134–144)
TOTAL PROTEIN: 6.8 g/dL (ref 6.0–8.5)

## 2018-02-05 LAB — CARBAMAZEPINE LEVEL, TOTAL: CARBAMAZEPINE LVL: 6.3 ug/mL (ref 4.0–12.0)

## 2018-02-05 LAB — TSH: TSH: 1.92 u[IU]/mL (ref 0.450–4.500)

## 2018-02-05 LAB — VALPROIC ACID LEVEL: Valproic Acid Lvl: 63 ug/mL (ref 50–100)

## 2018-02-11 ENCOUNTER — Encounter: Payer: Self-pay | Admitting: Family Medicine

## 2018-02-11 NOTE — Progress Notes (Signed)
Subjective:  Patient ID: Trevor Lee, male    DOB: 02-27-1991  Age: 27 y.o. MRN: [id number]  CC: Annual Exam   HPI Trevor Lee presents for annual physical.  Depression screen Trevor Lee 2/9 02/04/2018 08/05/2017 07/08/2017  Decreased Interest 0 0 0  Down, Depressed, Hopeless 0 0 0  PHQ - 2 Score 0 0 0    History Trevor Lee has a past medical history of Autistic disorder and Seizures ([place]).   Trevor Lee has a past surgical history that includes Tendon transfer elbow (Left, December 2013).   His family history is not on file.Trevor Lee reports that Trevor Lee has never smoked. Trevor Lee has never used smokeless tobacco. Trevor Lee reports that Trevor Lee does not drink alcohol or use drugs.    ROS Review of Systems  Constitutional: Negative.   HENT: Negative.   Eyes: Negative for visual disturbance.  Respiratory: Negative for cough and shortness of breath.   Cardiovascular: Negative for chest pain and leg swelling.  Gastrointestinal: Negative for abdominal pain, diarrhea, nausea and vomiting.  Genitourinary: Negative for difficulty urinating.  Musculoskeletal: Negative for arthralgias and myalgias.  Skin: Negative for rash.  Neurological: Negative for headaches.  Psychiatric/Behavioral: Negative for sleep disturbance.    Objective:  BP 125/75   Pulse 79   Ht '5\' 7"'  (1.702 m)   Wt 205 lb 6 oz (93.2 kg)   BMI 32.17 kg/m   BP Readings from Last 3 Encounters:  02/04/18 125/75  08/05/17 112/75  07/08/17 129/72    Wt Readings from Last 3 Encounters:  02/04/18 205 lb 6 oz (93.2 kg)  08/05/17 202 lb (91.6 kg)  07/08/17 200 lb (90.7 kg)     Physical Exam  Constitutional: Trevor Lee is oriented to person, place, and time. No distress.  HENT:  Head: Normocephalic and atraumatic.  Right Ear: External ear normal.  Left Ear: External ear normal.  Nose: Nose normal.  Mouth/Throat: Oropharynx is clear and moist.  Eyes: Pupils are equal, round, and reactive to light. Conjunctivae and EOM are normal. No scleral icterus.    Neck: Normal range of motion. Neck supple. No JVD present. No tracheal deviation present. No thyromegaly present.  Cardiovascular: Normal rate, regular rhythm and normal heart sounds. Exam reveals no gallop and no friction rub.  No murmur heard. Pulmonary/Chest: Effort normal and breath sounds normal. No respiratory distress. Trevor Lee has no wheezes. Trevor Lee has no rales. Trevor Lee exhibits no tenderness.  Abdominal: Soft. Bowel sounds are normal. Trevor Lee exhibits no distension and no mass. There is no tenderness. There is no rebound and no guarding.  Musculoskeletal: Normal range of motion. Trevor Lee exhibits no edema or tenderness.  Lymphadenopathy:    Trevor Lee has no cervical adenopathy.  Neurological: Trevor Lee is alert and oriented to person, place, and time. Trevor Lee displays normal reflexes. No cranial nerve deficit. Trevor Lee exhibits normal muscle tone. Coordination normal.  Skin: Skin is warm and dry. No rash noted. Trevor Lee is not diaphoretic. No erythema.  Psychiatric: Judgment normal.  Vitals reviewed.     Assessment & Plan:   Trevor Lee was seen today for annual exam.  Diagnoses and all orders for this visit:  Well adult exam -     CBC with Differential/Platelet -     CMP14+EGFR -     Lipid panel -     TSH  Simple partial seizures evolving to generalized tonic-clonic seizures (HCC) -     Valproic acid level -     Carbamazepine level, total -     Lipid panel -  TSH       I am having Trevor Lee maintain his INTUNIV, divalproex, and TEGRETOL-XR.  Allergies as of 02/04/2018   No Known Allergies     Medication List        Accurate as of 02/04/18 11:59 PM. Always use your most recent med list.          divalproex 500 MG DR tablet Commonly known as:  DEPAKOTE TAKE 1 TABLET BY MOUTH IN THE MORNING, TAKE 1 TABLET AT MID-DAY, AND 2 TABLETS AT BEDTIME   INTUNIV 4 MG Tb24 ER tablet Generic drug:  guanFACINE   TEGRETOL-XR 100 MG 12 hr tablet Generic drug:  carbamazepine TAKE 2 TABLETS BY MOUTH IN THE  MORNING, 1 TAB AT NOON, AND 2 TABS AT BEDTIME        Follow-up: Return in about 6 months (around 08/06/2018).  Claretta Fraise, M.D.

## 2018-02-21 ENCOUNTER — Telehealth: Payer: Self-pay | Admitting: Family Medicine

## 2018-02-21 NOTE — Telephone Encounter (Signed)
Reviewed with mother (on hippa) And copy mailed

## 2018-05-18 DIAGNOSIS — M25775 Osteophyte, left foot: Secondary | ICD-10-CM | POA: Diagnosis not present

## 2018-05-18 DIAGNOSIS — L600 Ingrowing nail: Secondary | ICD-10-CM | POA: Diagnosis not present

## 2018-06-07 DIAGNOSIS — Z23 Encounter for immunization: Secondary | ICD-10-CM | POA: Diagnosis not present

## 2018-07-01 DIAGNOSIS — R0982 Postnasal drip: Secondary | ICD-10-CM | POA: Diagnosis not present

## 2018-07-01 DIAGNOSIS — R05 Cough: Secondary | ICD-10-CM | POA: Diagnosis not present

## 2018-07-08 ENCOUNTER — Other Ambulatory Visit (INDEPENDENT_AMBULATORY_CARE_PROVIDER_SITE_OTHER): Payer: Self-pay | Admitting: Family

## 2018-12-05 ENCOUNTER — Telehealth (INDEPENDENT_AMBULATORY_CARE_PROVIDER_SITE_OTHER): Payer: Self-pay | Admitting: Family

## 2018-12-05 NOTE — Telephone Encounter (Signed)
°  Who's calling (name and relationship to patient) : Azzie Almas, mom  Best contact number: [phone]  Provider they see: Rockwell Germany.   Reason for call: Mom calling states that after patient turns 69 will have to get his own insurance, mom is care giver and is trying to get him set up on his own plan. When she was looking into plans noticed that Rockwell [place] was not listed, but our practice was listed. Mom is wondering if he will still be in network with Otila Kluver even if she is not listed. Notified mom that Junie Panning would have to give her a call regarding this. Also, states she will bring in caregiver paperwork since that's not on file to give [place] permission to speak with mom, since Muriel is special needs and 18+. Please advise on insurance questions.  PRESCRIPTION REFILL ONLY  Name of prescription:  Pharmacy:

## 2018-12-26 ENCOUNTER — Other Ambulatory Visit (INDEPENDENT_AMBULATORY_CARE_PROVIDER_SITE_OTHER): Payer: Self-pay | Admitting: Family

## 2018-12-26 DIAGNOSIS — G40109 Localization-related (focal) (partial) symptomatic epilepsy and epileptic syndromes with simple partial seizures, not intractable, without status epilepticus: Secondary | ICD-10-CM

## 2018-12-26 DIAGNOSIS — G40119 Localization-related (focal) (partial) symptomatic epilepsy and epileptic syndromes with simple partial seizures, intractable, without status epilepticus: Secondary | ICD-10-CM

## 2018-12-26 NOTE — Telephone Encounter (Signed)
Please send to the pharmacy °

## 2019-01-09 ENCOUNTER — Encounter (INDEPENDENT_AMBULATORY_CARE_PROVIDER_SITE_OTHER): Payer: Self-pay | Admitting: Family

## 2019-01-09 ENCOUNTER — Ambulatory Visit (INDEPENDENT_AMBULATORY_CARE_PROVIDER_SITE_OTHER): Payer: 59 | Admitting: Family

## 2019-01-09 ENCOUNTER — Other Ambulatory Visit: Payer: Self-pay

## 2019-01-09 DIAGNOSIS — G8114 Spastic hemiplegia affecting left nondominant side: Secondary | ICD-10-CM | POA: Diagnosis not present

## 2019-01-09 DIAGNOSIS — G808 Other cerebral palsy: Secondary | ICD-10-CM | POA: Diagnosis not present

## 2019-01-09 DIAGNOSIS — F902 Attention-deficit hyperactivity disorder, combined type: Secondary | ICD-10-CM

## 2019-01-09 DIAGNOSIS — G40109 Localization-related (focal) (partial) symptomatic epilepsy and epileptic syndromes with simple partial seizures, not intractable, without status epilepticus: Secondary | ICD-10-CM | POA: Diagnosis not present

## 2019-01-09 DIAGNOSIS — G40409 Other generalized epilepsy and epileptic syndromes, not intractable, without status epilepticus: Secondary | ICD-10-CM

## 2019-01-09 DIAGNOSIS — F79 Unspecified intellectual disabilities: Secondary | ICD-10-CM

## 2019-01-09 DIAGNOSIS — F802 Mixed receptive-expressive language disorder: Secondary | ICD-10-CM

## 2019-01-09 DIAGNOSIS — R269 Unspecified abnormalities of gait and mobility: Secondary | ICD-10-CM

## 2019-01-09 DIAGNOSIS — F422 Mixed obsessional thoughts and acts: Secondary | ICD-10-CM

## 2019-01-09 DIAGNOSIS — R482 Apraxia: Secondary | ICD-10-CM

## 2019-01-09 MED ORDER — DIVALPROEX SODIUM 500 MG PO DR TAB: DELAYED_RELEASE_TABLET | ORAL | 3 refills | Status: DC

## 2019-01-09 NOTE — Patient Instructions (Signed)
Thank you for meeting with me by Webex today.   Instructions for you until your next appointment are as follows: 1. Continue taking your medications as you have been doing 2. Let me know if you have any seizures 3. It is important for you to drink at least 48-60 oz of water each day to help with constipation 4. Please sign up for MyChart if you have not done so 5. Please plan to return for follow up in one year or sooner if needed.

## 2019-01-09 NOTE — Progress Notes (Signed)
This is a Pediatric Specialist E-Visit follow up consult provided via [place] and their parent/guardian  Trevor Lee (name of consenting adult) consented to an E-Visit consult today.  Location of patient: Trevor Lee is at Home (location) Location of provider: Lynn Ito is at office (location) Patient was referred by Claretta Fraise, MD   The following participants were involved in this E-Visit: Sabino Niemann, CMA         Rockwell Germany, MD (list of participants and their roles)  Chief Complain/ Reason for E-Visit today: seizure and behavior follow up Total time on call: 20 min Follow up: one year   Trevor Lee   MRN:  [id number]  Jun 13, 1997   Provider: Rockwell Germany NP-C Location of Care: [facility]  Visit type: Return visit  Last visit: 06/23/2017  Referral source: Claretta Fraise, MD History from: [facility] chart, patient and his mother  Brief history:  Trevor Lee is a 28 year old Trevor Lee man with history of congential left hemiparesis, left hemiatrophy, partial seizures of right brain signature, 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 Trevor Lee is taking and tolerating Tegretol DR and generic Divalproex for his seizure disorder and has remained seizure free since September 2016.  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 Trevor Lee has had problems with his mood and with compulsive behaviors in the past, but has had problems with tolerance.   Today's concerns: Mom reports today that Trevor Lee has been doing better with mood and behavior since his last visit. He is taking Intuniv for attention and impulsive behavior.  He has remained seizure free as mentioned. Trevor Lee has been doing well with the Covid 19 pandemic restrictions but  is anxious to get to use his family pool in a few weeks. Mom reports that Trevor Lee has had problems with constipation and that she has been working with him to drink more water and to consume high fiber foods. Trevor Lee has been otherwise generally healthy and Mom has no other health concerns for him today other than previously mentioned.   Review of systems: Please see HPI for neurologic and other pertinent review of systems. Otherwise all other systems were reviewed and were negative.  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   Social history: Social History   Socioeconomic History   Marital status: Single    Spouse name: Not on file   Number of children: Not on file   Years of education: Not on file   Highest education level: Not  on file  Occupational History   Not on file  Social Needs   Financial resource strain: Not on file   Food insecurity:    Worry: Not on file    Inability: Not on file   Transportation needs:    Medical: Not on file    Non-medical: Not on file  Tobacco Use   Smoking status: Never Smoker   Smokeless tobacco: Never  Used  Substance and Sexual Activity   Alcohol use: No   Drug use: No   Sexual activity: Never  Lifestyle   Physical activity:    Days per week: Not on file    Minutes per session: Not on file   Stress: Not on file  Relationships   Social connections:    Talks on phone: Not on file    Gets together: Not on file    Attends religious service: Not on file    Active member of club or organization: Not on file    Attends meetings of clubs or organizations: Not on file    Relationship status: Not on file   Intimate partner violence:    Fear of current or ex partner: Not on file    Emotionally abused: Not on file    Physically abused: Not on file    Forced sexual activity: Not on file  Other Topics Concern   Not on file  Social History Narrative   He graduated from [employer] where he was in a self contained class setting. He has had difficulty with attention span, and difficulty keeping up with the pace of regular classes. Trevor Lee is now in enrolled in a day program 5 days per week. He enjoys riding his bike, [employer] basketball, listening to music, computer and playing music. He is employed at the [employer] where he does [employer].    Past/failed meds: Abilify - weight gain Vyvanse - tachycardia CBD oil - no improvement in behavior  Allergies: No Known Allergies    Immunizations: Immunization History  Administered Date(s) Administered   DTaP 04/25/1993, 06/26/1993, 08/26/1993, 06/08/1994, 03/25/1998   Hepatitis A 12/09/2005, 03/03/2007   Hepatitis B June 10, 1993, 04/25/1993, 08/26/1993   HiB (PRP-OMP) 04/25/1993, 06/26/1993, 08/26/1993, 06/08/1994   IPV 04/25/1993, 06/26/1993, 08/26/1993, 04/18/1998   Influenza,inj,Quad PF,6+ Mos 07/11/2015, 06/10/2017   Influenza-Unspecified 06/07/2018   MMR 06/08/1994, 03/25/1998   Meningococcal Conjugate 12/09/2005   Td 10/09/2003, 07/11/2015   Tdap 07/11/2015   Varicella 05/04/1995    Physical  Exam: There were no vitals taken for this visit.  General: well developed, well nourished Trevor Lee man, seated, in no evident distress; brown hair, brown eyes; right handed Head: normocephalic and atraumatic. No dysmorphic features. Neck: supple Musculoskeletal: Contractures of the left arm and leg with left wrist drop, clawhand deformity, decreased range of motion at the left elbow. Wears left AFO Skin: no rashes or neurocutaneous lesions  Neurologic Exam Mental Status: Awake and fully alert.  Attention span, concentration, and fund of knowledge subnormal for age.  Speech with mild dysarthria.  Able to follow basic commands and participate in examination but was silly at times and needed frequent redirection. Hugged and kissed his mother's cheek frequently Cranial Nerves: Extraocular movements appear full. Hearing appeared to be intact to his mother's whisper. Face and tongue moved normally and symmetrically.  Neck flexion and extension normal. Motor: Normal functional bulk tone and strength on the right. Significant clumsiness and evidence of weakness on the left, with left arm and hand contractures.  Sensory: Unable to  adequately assess on Webex visit Coordination: Rapid movements appear normal on the right, clumsy on the left Gait and Station: Arises from chair, without difficulty. It was difficult to see on the video but he has a left hemiparetic gait. Wearing AFO on the left.   Impression: 1. Localization related epilepsy 2. Congenital left hemiparesis with left hemiatrophy 3. Moderate mixed receptive and expressive language disorder 4.  Dyspraxia 5.  Dysgraphia 6.  Attention deficit disorder 7.  Obsessive compulsive disorder 8.  Aggressive behvaior 9.  Intellectual disability  Recommendations for plan of care: The patient's previous [facility] records were reviewed. Trevor Lee has neither had nor required imaging or lab studies since the last visit. 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 He is taking and tolerating Tegretol XR and generic Divalproex and has remained seizure free since September 2016. He is taking Intuniv for problems with attention and impulsive behavior. Trevor Lee is doing well at this time and I will make no changes in his treatment plan. He has been having problems with constipation and I commended his mother for working with him to drink more water and to consume a healthy, high fiber diet. I will see Trevor Lee back in follow up in 1 year or sooner if needed. Mom agreed with the plans made today.   The medication list was reviewed and reconciled. No changes were made in the prescribed medications today. A complete medication list was provided to the patient.  Allergies as of 01/09/2019   No Known Allergies     Medication List       Accurate as of Jan 09, 2019 11:50 AM. Always use your most recent med list.        divalproex 500 MG DR tablet Commonly known as:  DEPAKOTE TAKE 1 TABLET BY MOUTH IN THE MORNING, TAKE 1 TABLET AT MID-DAY, AND 2 TABLETS AT BEDTIME   Intuniv 4 MG Tb24 ER tablet Generic drug:  guanFACINE   TEGretol-XR 100 MG 12 hr tablet Generic drug:  carbamazepine TAKE 2 TABLETS BY MOUTH EVERY MORNING TAKE 1 TABLET AT NOON,AND TAKE 2 TABLETS AT BEDTIME        Total time spent with the patient was 20 minutes, of which 50% or more was spent in counseling and coordination of care.  Rockwell Germany NP-C [facility] Ph. [phone] Fax [phone]

## 2019-01-23 ENCOUNTER — Other Ambulatory Visit (INDEPENDENT_AMBULATORY_CARE_PROVIDER_SITE_OTHER): Payer: Self-pay | Admitting: Family

## 2019-01-23 DIAGNOSIS — G40119 Localization-related (focal) (partial) symptomatic epilepsy and epileptic syndromes with simple partial seizures, intractable, without status epilepticus: Secondary | ICD-10-CM

## 2019-01-23 DIAGNOSIS — G40109 Localization-related (focal) (partial) symptomatic epilepsy and epileptic syndromes with simple partial seizures, not intractable, without status epilepticus: Secondary | ICD-10-CM

## 2019-01-24 ENCOUNTER — Telehealth (INDEPENDENT_AMBULATORY_CARE_PROVIDER_SITE_OTHER): Payer: Self-pay | Admitting: Pediatrics

## 2019-01-24 NOTE — Telephone Encounter (Signed)
I was asked by mother to call in a prescription for Tegretol-XR.  He had been called in yesterday, but when I contacted the pharmacy they did not have any record of it.  I called in a verbal prescription for the same signature quantity and refills so that there would not be confusion in the chart.  This is medically necessary to dispense as written.  Both divalproex and Tegretol have been refilled for 1 year the former on May 4, the latter on the 19th.

## 2019-04-11 DIAGNOSIS — F71 Moderate intellectual disabilities: Secondary | ICD-10-CM | POA: Diagnosis not present

## 2019-04-11 DIAGNOSIS — F840 Autistic disorder: Secondary | ICD-10-CM | POA: Diagnosis not present

## 2019-04-11 DIAGNOSIS — F428 Other obsessive-compulsive disorder: Secondary | ICD-10-CM | POA: Diagnosis not present

## 2019-04-11 DIAGNOSIS — F900 Attention-deficit hyperactivity disorder, predominantly inattentive type: Secondary | ICD-10-CM | POA: Diagnosis not present

## 2019-07-10 DIAGNOSIS — F840 Autistic disorder: Secondary | ICD-10-CM | POA: Diagnosis not present

## 2019-07-10 DIAGNOSIS — F428 Other obsessive-compulsive disorder: Secondary | ICD-10-CM | POA: Diagnosis not present

## 2019-07-10 DIAGNOSIS — F71 Moderate intellectual disabilities: Secondary | ICD-10-CM | POA: Diagnosis not present

## 2019-07-10 DIAGNOSIS — F900 Attention-deficit hyperactivity disorder, predominantly inattentive type: Secondary | ICD-10-CM | POA: Diagnosis not present

## 2019-10-03 DIAGNOSIS — F840 Autistic disorder: Secondary | ICD-10-CM | POA: Diagnosis not present

## 2019-10-03 DIAGNOSIS — F71 Moderate intellectual disabilities: Secondary | ICD-10-CM | POA: Diagnosis not present

## 2019-10-03 DIAGNOSIS — F428 Other obsessive-compulsive disorder: Secondary | ICD-10-CM | POA: Diagnosis not present

## 2019-10-03 DIAGNOSIS — F900 Attention-deficit hyperactivity disorder, predominantly inattentive type: Secondary | ICD-10-CM | POA: Diagnosis not present

## 2019-11-06 DIAGNOSIS — F840 Autistic disorder: Secondary | ICD-10-CM | POA: Diagnosis not present

## 2019-11-06 DIAGNOSIS — F71 Moderate intellectual disabilities: Secondary | ICD-10-CM | POA: Diagnosis not present

## 2019-11-06 DIAGNOSIS — F428 Other obsessive-compulsive disorder: Secondary | ICD-10-CM | POA: Diagnosis not present

## 2019-11-06 DIAGNOSIS — F900 Attention-deficit hyperactivity disorder, predominantly inattentive type: Secondary | ICD-10-CM | POA: Diagnosis not present

## 2019-12-28 DIAGNOSIS — F428 Other obsessive-compulsive disorder: Secondary | ICD-10-CM | POA: Diagnosis not present

## 2019-12-28 DIAGNOSIS — F900 Attention-deficit hyperactivity disorder, predominantly inattentive type: Secondary | ICD-10-CM | POA: Diagnosis not present

## 2019-12-28 DIAGNOSIS — F840 Autistic disorder: Secondary | ICD-10-CM | POA: Diagnosis not present

## 2019-12-28 DIAGNOSIS — F71 Moderate intellectual disabilities: Secondary | ICD-10-CM | POA: Diagnosis not present

## 2020-01-23 ENCOUNTER — Other Ambulatory Visit (INDEPENDENT_AMBULATORY_CARE_PROVIDER_SITE_OTHER): Payer: Self-pay | Admitting: Family

## 2020-01-23 DIAGNOSIS — G40409 Other generalized epilepsy and epileptic syndromes, not intractable, without status epilepticus: Secondary | ICD-10-CM

## 2020-01-28 ENCOUNTER — Other Ambulatory Visit (INDEPENDENT_AMBULATORY_CARE_PROVIDER_SITE_OTHER): Payer: Self-pay | Admitting: Family

## 2020-01-28 DIAGNOSIS — G40409 Other generalized epilepsy and epileptic syndromes, not intractable, without status epilepticus: Secondary | ICD-10-CM

## 2020-01-29 ENCOUNTER — Telehealth (INDEPENDENT_AMBULATORY_CARE_PROVIDER_SITE_OTHER): Payer: Self-pay | Admitting: Family

## 2020-01-29 DIAGNOSIS — G40109 Localization-related (focal) (partial) symptomatic epilepsy and epileptic syndromes with simple partial seizures, not intractable, without status epilepticus: Secondary | ICD-10-CM

## 2020-01-29 DIAGNOSIS — G40119 Localization-related (focal) (partial) symptomatic epilepsy and epileptic syndromes with simple partial seizures, intractable, without status epilepticus: Secondary | ICD-10-CM

## 2020-01-29 NOTE — Telephone Encounter (Signed)
This has to be printed and faxed as Brand Medically Necessary. I will print and fax tomorrow morning when I am in the office.

## 2020-01-29 NOTE — Telephone Encounter (Signed)
Please send to the pharmacy °

## 2020-01-29 NOTE — Telephone Encounter (Signed)
  Who's calling (name and relationship to patient) : Azzie Almas  Best contact number: [phone]  Provider they see: Rockwell Germany  Reason for call: Mom states that patient is going out of town on Wednesday and needs Tegretol sent to pharmacy before he leaves.    PRESCRIPTION REFILL ONLY  Name of prescription: Tegretol  [facility]: [facility], [address], [place]

## 2020-01-30 MED ORDER — TEGRETOL-XR 100 MG PO TB12: ORAL_TABLET | ORAL | 0 refills | Status: DC

## 2020-01-30 NOTE — Telephone Encounter (Signed)
Rx faxed to pharmacy. TG 

## 2020-02-12 ENCOUNTER — Telehealth (INDEPENDENT_AMBULATORY_CARE_PROVIDER_SITE_OTHER): Payer: Self-pay | Admitting: Family

## 2020-02-12 NOTE — Telephone Encounter (Signed)
  Who's calling (name and relationship to patient) : Claiborne Billings (mom)  Best contact number: [phone]  Provider they see: Rockwell Germany  Reason for call: Patient has appointment on Tuesday morning with Rockwell Germany. When filling out the [employer] Covid questionnaire mom noted that patient had cough, congestion and slight fever (under 100) last week. He is currently symptom-free with the exception of a lingering cough. Patient is fully vaccinated. Mom wanted to know if he was okay to keep appointment. Requests call back.    PRESCRIPTION REFILL ONLY  Name of prescription:  Pharmacy:

## 2020-02-12 NOTE — Telephone Encounter (Signed)
I called and talked to Mom. I told her that I can see [name] tomorrow by video visit or that he can be rescheduled when is is 10 days symptom free. Mom decided to do video visit tomorrow. TG

## 2020-02-13 ENCOUNTER — Encounter (INDEPENDENT_AMBULATORY_CARE_PROVIDER_SITE_OTHER): Payer: Self-pay

## 2020-02-13 ENCOUNTER — Ambulatory Visit (INDEPENDENT_AMBULATORY_CARE_PROVIDER_SITE_OTHER): Payer: 59 | Admitting: Family

## 2020-02-22 ENCOUNTER — Ambulatory Visit (INDEPENDENT_AMBULATORY_CARE_PROVIDER_SITE_OTHER): Payer: BC Managed Care – PPO | Admitting: Family

## 2020-02-22 ENCOUNTER — Other Ambulatory Visit: Payer: Self-pay

## 2020-02-22 ENCOUNTER — Encounter (INDEPENDENT_AMBULATORY_CARE_PROVIDER_SITE_OTHER): Payer: Self-pay | Admitting: Family

## 2020-02-22 VITALS — BP 122/80 | HR 80 | Ht 67.0 in | Wt 205.6 lb

## 2020-02-22 DIAGNOSIS — G40409 Other generalized epilepsy and epileptic syndromes, not intractable, without status epilepticus: Secondary | ICD-10-CM | POA: Diagnosis not present

## 2020-02-22 DIAGNOSIS — R269 Unspecified abnormalities of gait and mobility: Secondary | ICD-10-CM

## 2020-02-22 DIAGNOSIS — G40119 Localization-related (focal) (partial) symptomatic epilepsy and epileptic syndromes with simple partial seizures, intractable, without status epilepticus: Secondary | ICD-10-CM

## 2020-02-22 DIAGNOSIS — F902 Attention-deficit hyperactivity disorder, combined type: Secondary | ICD-10-CM

## 2020-02-22 DIAGNOSIS — F79 Unspecified intellectual disabilities: Secondary | ICD-10-CM

## 2020-02-22 DIAGNOSIS — G40109 Localization-related (focal) (partial) symptomatic epilepsy and epileptic syndromes with simple partial seizures, not intractable, without status epilepticus: Secondary | ICD-10-CM

## 2020-02-22 DIAGNOSIS — F422 Mixed obsessional thoughts and acts: Secondary | ICD-10-CM

## 2020-02-22 DIAGNOSIS — G8114 Spastic hemiplegia affecting left nondominant side: Secondary | ICD-10-CM | POA: Diagnosis not present

## 2020-02-22 DIAGNOSIS — F802 Mixed receptive-expressive language disorder: Secondary | ICD-10-CM

## 2020-02-22 DIAGNOSIS — G808 Other cerebral palsy: Secondary | ICD-10-CM

## 2020-02-22 NOTE — Progress Notes (Signed)
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 Trevor Lee is taking and tolerating Tegretol DR and generic Divalproex for his seizure disorder and has remained seizure free since September 2016.  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 Trevor Lee has had problems with his mood and with compulsive behaviors in the past, but has had problems with tolerance.   Today's concerns: Mom reports today that Trevor Lee has remained seizure free and has been doing well. He is starting back to previous activities now that pandemic restrictions are being lifted. His mood has been good for the most part.  Trevor Lee has been generally healthy since his last visit. Mom has no health concerns for him today other than previously mentioned.   Review of systems: Please see HPI for neurologic and other pertinent review of systems. Otherwise all other systems were reviewed and were negative.  Problem List: Patient Active Problem List   Diagnosis Date Noted  . Aggressive behavior 06/23/2017  . Mixed receptive-expressive language disorder 06/23/2017  . Intellectual disability 06/23/2017  . OCD (obsessive compulsive disorder) 07/11/2015  . 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 Spastic hemiplegia ([place]) 02/27/2013  . Attention deficit hyperactivity disorder  (ADHD) 02/27/2013  . Apraxia 02/27/2013  . Abnormality of gait 02/27/2013  . Long-term use of high-risk medication 02/27/2013  . Congenital hemiplegia ([place]) 02/27/2013     Past Medical History:  Diagnosis Date  . Autistic disorder   . Seizures ([place])     Past medical history comments: See HPI   Surgical history: Past Surgical History:  Procedure Laterality Date  . TENDON TRANSFER ELBOW Left December 2013     Family history: family history is not on file.   Social history: Social History   Socioeconomic History  . Marital status: Single    Spouse name: Not on file  . Number of children: Not on file  . Years of education: Not on file  . Highest education level: Not on file  Occupational History  . Not on file  Tobacco Use  . Smoking status: Never Smoker  . Smokeless tobacco: Never Used  Vaping Use  . Vaping Use: Never used  Substance and Sexual Activity  . Alcohol use: No  . Drug use: No  . Sexual activity: Never  Other Topics Concern  . Not on file  Social History Narrative   He graduated from [employer] where he was in a self contained class setting. He has had difficulty with attention span, and difficulty keeping up with the pace of regular classes. Trevor Lee is now in enrolled in a day program 5 days per week. He enjoys riding his bike, [employer] basketball, listening to music, computer and playing music. He is employed at the [employer] where he does [employer].   Social Determinants of Health   Financial Resource Strain:   . Difficulty of Paying Living Expenses:   Food Insecurity:   .  Worried About Charity fundraiser in the Last Year:   . Arboriculturist in the Last Year:   Transportation Needs:   . Film/video editor (Medical):   Marland Kitchen Lack of Transportation (Non-Medical):   Physical Activity:   . Days of Exercise per Week:   . Minutes of Exercise per Session:   Stress:   . Feeling of Stress :   Social Connections:   . Frequency of  Communication with Friends and Family:   . Frequency of Social Gatherings with Friends and Family:   . Attends Religious Services:   . Active Member of Clubs or Organizations:   . Attends Archivist Meetings:   Marland Kitchen Marital Status:   Intimate Partner Violence:   . Fear of Current or Ex-Partner:   . Emotionally Abused:   Marland Kitchen Physically Abused:   . Sexually Abused:      Past/failed meds: Copied from previous record Abilify - weight gain Vyvanse - tachycardia CBD oil - no improvement in behavior  Allergies: No Known Allergies   Immunizations: Immunization History  Administered Date(s) Administered  . DTaP 04/25/1993, 06/26/1993, 08/26/1993, 06/08/1994, 03/25/1998  . Hepatitis A 12/09/2005, 03/03/2007  . Hepatitis B 05/07/93, 04/25/1993, 08/26/1993  . HiB (PRP-OMP) 04/25/1993, 06/26/1993, 08/26/1993, 06/08/1994  . IPV 04/25/1993, 06/26/1993, 08/26/1993, 04/18/1998  . Influenza,inj,Quad PF,6+ Mos 07/11/2015, 06/10/2017  . Influenza-Unspecified 06/07/2018  . MMR 06/08/1994, 03/25/1998  . Meningococcal Conjugate 12/09/2005  . Td 10/09/2003, 07/11/2015  . Tdap 07/11/2015  . Varicella 05/04/1995     Diagnostics/Screenings:   Physical Exam: BP 122/80   Pulse 80   Ht '5\' 7"'  (1.702 m)   Wt 205 lb 9.6 oz (93.3 kg)   BMI 32.20 kg/m   General: well developed, well nourished man, seated in exam room, in no evident distress; brown hair, brown eyes, right handed Head: normocephalic and atraumatic. Oropharynx benign. No dysmorphic features. Neck: supple with no carotid bruits. Cardiovascular: regular rate and rhythm, no murmurs. Respiratory: Clear to auscultation bilaterally Musculoskeletal: Contractures of left arm and leg with left wrist drop, clawhand deformity, decreased range of motion at left elbow. Wears left AFO. Skin: no rashes or neurocutaneous lesions  Neurologic Exam Mental Status: Awake and fully alert. Attention span, concentration and fund of knowledge  subnormal for age. Speech with mild dysarthria. Able to follow basic commands and participate in examination. Interrupted often. Needed frequent redirection.  Cranial Nerves: Fundoscopic exam - red reflex present.  Unable to fully visualize fundus.  Pupils equal briskly reactive to light.  Turns to localize faces and objects in the periphery. Turns to localize sounds in the periphery. Facial movements are symmetric. Shoulder shrug is asymmetric.  Motor: Normal functional bulk, tone and strength on the right, significant clumsiness and weakness on the left, with left arm and hand contractures Sensory: Intact to touch and temperature Coordination: Unable to adequately assess due to patient's inability to participate in examination. No dysmetria when reaching for objects. Gait and Station: Arises from chair without difficulty. Has left hemiparetic gait.  Reflexes: Diminished and symmetric.  Impression: 1. Localization related epilepsy 2. Congenital left hemiparesis with left hemiatrophy 3. Moderate mixed receptive and expressive language disorder 4. Dyspraxia 5. Dysgraphia 6. Attention deficit disorder 7. Obsessive compulsive disorder 8. Aggressive behavior 9. Intellectual disability  Recommendations for plan of care: The patient's previous [facility] records were reviewed. Trevor Lee has neither had nor required imaging or lab studies since the last visit. He is a 29 year old man with history  of localization related epilepsy, congenital left hemiparesis with left hemiatrophy, mixed receptive and expressive language disorder, intellectual disability, obsessive compulsive disorder and intermittent problems with mood. He has remained seizure free since September 2016 on Tegretol XR and generic Divalproex. He behavior has not been problematic. I will make no changes in his treatment plan at this time. I asked his mother to let me know if he has seizures or if she has other concerns. I will otherwise see [name] back  in a year or sooner if needed. Mom agreed with the plans made today.  The medication list was reviewed and reconciled. No changes were made in the prescribed medications today. A complete medication list was provided to the patient.  Allergies as of 02/22/2020   No Known Allergies     Medication List       Accurate as of February 22, 2020 11:59 PM. If you have any questions, ask your nurse or doctor.        chlorhexidine 0.12 % solution Commonly known as: PERIDEX RINSE WITH 1/2 OZ TWICE A WEEK BEFORE BEDTIME   divalproex 500 MG DR tablet Commonly known as: DEPAKOTE TAKE 1 TABLET BY MOUTH IN THE MORNING, TAKE 1 TABLET AT MID-DAY, AND 2 TABLETS AT BEDTIME   Intuniv 4 MG Tb24 ER tablet Generic drug: guanFACINE   Omega-3 1000 MG Caps Take by mouth.   Sodium Fluoride 5000 PPM 1.1 % Pste Generic drug: Sodium Fluoride USE THIN RIBBON OF PASTE ON TOOTHBRUSH AT BEDTIME. DO NOT RINSE   TEGretol-XR 100 MG 12 hr tablet Generic drug: carbamazepine TAKE 2 TABLETS BY MOUTH EVERY MORNING TAKE 1 TABLET AT NOON,AND TAKE 2 TABLETS AT BEDTIME        Total time spent with the patient was 20 minutes, of which 50% or more was spent in counseling and coordination of care.  Rockwell Germany NP-C [facility] Ph. [phone] Fax [phone]

## 2020-02-29 ENCOUNTER — Encounter (INDEPENDENT_AMBULATORY_CARE_PROVIDER_SITE_OTHER): Payer: Self-pay | Admitting: Family

## 2020-02-29 ENCOUNTER — Other Ambulatory Visit (INDEPENDENT_AMBULATORY_CARE_PROVIDER_SITE_OTHER): Payer: Self-pay | Admitting: Pediatrics

## 2020-02-29 DIAGNOSIS — G40109 Localization-related (focal) (partial) symptomatic epilepsy and epileptic syndromes with simple partial seizures, not intractable, without status epilepticus: Secondary | ICD-10-CM

## 2020-02-29 DIAGNOSIS — G40119 Localization-related (focal) (partial) symptomatic epilepsy and epileptic syndromes with simple partial seizures, intractable, without status epilepticus: Secondary | ICD-10-CM

## 2020-02-29 MED ORDER — DIVALPROEX SODIUM 500 MG PO DR TAB: DELAYED_RELEASE_TABLET | ORAL | 3 refills | Status: DC

## 2020-02-29 MED ORDER — TEGRETOL-XR 100 MG PO TB12: ORAL_TABLET | ORAL | 3 refills | Status: DC

## 2020-02-29 NOTE — Patient Instructions (Signed)
Thank you for coming in today.   Instructions for you until your next appointment are as follows: 1. Continue your medications without change for now 2. Let me know if you have any seizures 3. Please sign up for MyChart if you have not done so 4. Please plan to return for follow up in one year or sooner if needed.

## 2020-03-19 ENCOUNTER — Telehealth: Payer: Self-pay | Admitting: Family Medicine

## 2020-03-19 NOTE — Telephone Encounter (Signed)
Video Visit scheduled for tomorrow at 3:55 with [facility].

## 2020-03-20 ENCOUNTER — Telehealth (INDEPENDENT_AMBULATORY_CARE_PROVIDER_SITE_OTHER): Payer: BC Managed Care – PPO | Admitting: Family Medicine

## 2020-03-20 DIAGNOSIS — J329 Chronic sinusitis, unspecified: Secondary | ICD-10-CM | POA: Diagnosis not present

## 2020-03-20 DIAGNOSIS — J40 Bronchitis, not specified as acute or chronic: Secondary | ICD-10-CM | POA: Diagnosis not present

## 2020-03-20 MED ORDER — PREDNISONE 10 MG PO TABS
ORAL_TABLET | ORAL | 0 refills | Status: DC
Start: 2020-03-20 — End: 2020-07-30

## 2020-03-20 MED ORDER — AMOXICILLIN-POT CLAVULANATE 875-125 MG PO TABS: 1.0000 | ORAL_TABLET | Freq: Two times a day (BID) | ORAL | 0 refills | Status: DC

## 2020-03-21 DIAGNOSIS — F840 Autistic disorder: Secondary | ICD-10-CM | POA: Diagnosis not present

## 2020-03-21 DIAGNOSIS — F71 Moderate intellectual disabilities: Secondary | ICD-10-CM | POA: Diagnosis not present

## 2020-03-21 DIAGNOSIS — F900 Attention-deficit hyperactivity disorder, predominantly inattentive type: Secondary | ICD-10-CM | POA: Diagnosis not present

## 2020-03-21 DIAGNOSIS — F428 Other obsessive-compulsive disorder: Secondary | ICD-10-CM | POA: Diagnosis not present

## 2020-03-24 ENCOUNTER — Encounter: Payer: Self-pay | Admitting: Family Medicine

## 2020-03-24 NOTE — Progress Notes (Signed)
Subjective:    Patient ID: Trevor Lee, male    DOB: August 22, 1991, 29 y.o.   MRN: [id number]   HPI: Trevor Lee is a 29 y.o. male presenting for persistent cough.  Onset of the cough was approximately [facility] that is May 30 this year.  He had cough and fever to 99.9.  He has been vaccinated and tested negative for Covid.  The cough has been persistent although much of the other symptoms have resolved.  The cough is productive of yellow sputum intermittently.  He denies shortness of breath and dyspnea on exertion.   Depression screen [facility] 2/9 02/04/2018 08/05/2017 07/08/2017 03/29/2017 02/26/2017  Decreased Interest 0 0 0 0 0  Down, Depressed, Hopeless 0 0 0 0 0  PHQ - 2 Score 0 0 0 0 0     Relevant past medical, surgical, family and social history reviewed and updated as indicated.  Interim medical history since our last visit reviewed. Allergies and medications reviewed and updated.  ROS:  Review of Systems  Constitutional: Negative for fever.  HENT: Positive for postnasal drip.   Respiratory: Positive for cough. Negative for shortness of breath.      Social History   Tobacco Use  Smoking Status Never Smoker  Smokeless Tobacco Never Used       Objective:     Wt Readings from Last 3 Encounters:  02/22/20 205 lb 9.6 oz (93.3 kg)  02/04/18 205 lb 6 oz (93.2 kg)  08/05/17 202 lb (91.6 kg)     Exam deferred. Pt. Harboring due to COVID 19. Phone visit performed.   Assessment & Plan:   1. Sinobronchitis     Meds ordered this encounter  Medications  . amoxicillin-clavulanate (AUGMENTIN) 875-125 MG tablet    Sig: Take 1 tablet by mouth 2 (two) times daily. Take all of this medication    Dispense:  20 tablet    Refill:  0  . predniSONE (DELTASONE) 10 MG tablet    Sig: Take 5 daily for 2 days followed by 4,3,2 and 1 for 2 days each.    Dispense:  30 tablet    Refill:  0    No orders of the defined types were placed in this encounter.      Diagnoses and all orders for this visit:  Sinobronchitis  Other orders -     amoxicillin-clavulanate (AUGMENTIN) 875-125 MG tablet; Take 1 tablet by mouth 2 (two) times daily. Take all of this medication -     predniSONE (DELTASONE) 10 MG tablet; Take 5 daily for 2 days followed by 4,3,2 and 1 for 2 days each.    Virtual Visit via telephone Note  I discussed the limitations, risks, security and privacy concerns of performing an evaluation and management service by telephone and the availability of in person appointments. The patient was identified with two identifiers. Pt.expressed understanding and agreed to proceed. Pt. Is at home. Dr. Livia Snellen is in his office.  Follow Up Instructions:   I discussed the assessment and treatment plan with the patient. The patient was provided an opportunity to ask questions and all were answered. The patient agreed with the plan and demonstrated an understanding of the instructions.   The patient was advised to call back or seek an in-person evaluation if the symptoms worsen or if the condition fails to improve as anticipated.   Total minutes including chart review and phone contact time: 9   Follow up plan: No follow-ups on file.  Claretta Fraise, MD [place]

## 2020-06-13 DIAGNOSIS — F71 Moderate intellectual disabilities: Secondary | ICD-10-CM | POA: Diagnosis not present

## 2020-06-13 DIAGNOSIS — F840 Autistic disorder: Secondary | ICD-10-CM | POA: Diagnosis not present

## 2020-06-13 DIAGNOSIS — F900 Attention-deficit hyperactivity disorder, predominantly inattentive type: Secondary | ICD-10-CM | POA: Diagnosis not present

## 2020-06-13 DIAGNOSIS — F428 Other obsessive-compulsive disorder: Secondary | ICD-10-CM | POA: Diagnosis not present

## 2020-07-30 ENCOUNTER — Other Ambulatory Visit: Payer: Self-pay

## 2020-07-30 ENCOUNTER — Ambulatory Visit (INDEPENDENT_AMBULATORY_CARE_PROVIDER_SITE_OTHER): Payer: BC Managed Care – PPO | Admitting: Family Medicine

## 2020-07-30 ENCOUNTER — Encounter: Payer: Self-pay | Admitting: Family Medicine

## 2020-07-30 VITALS — BP 128/71 | HR 69 | Temp 97.0°F | Resp 20 | Ht 67.0 in | Wt 208.4 lb

## 2020-07-30 DIAGNOSIS — G40409 Other generalized epilepsy and epileptic syndromes, not intractable, without status epilepticus: Secondary | ICD-10-CM | POA: Diagnosis not present

## 2020-07-30 DIAGNOSIS — F902 Attention-deficit hyperactivity disorder, combined type: Secondary | ICD-10-CM

## 2020-07-30 DIAGNOSIS — F422 Mixed obsessional thoughts and acts: Secondary | ICD-10-CM

## 2020-07-30 DIAGNOSIS — Z0001 Encounter for general adult medical examination with abnormal findings: Secondary | ICD-10-CM

## 2020-07-30 DIAGNOSIS — Z0000 Encounter for general adult medical examination without abnormal findings: Secondary | ICD-10-CM

## 2020-07-30 DIAGNOSIS — E782 Mixed hyperlipidemia: Secondary | ICD-10-CM

## 2020-07-30 DIAGNOSIS — G808 Other cerebral palsy: Secondary | ICD-10-CM

## 2020-07-30 NOTE — Progress Notes (Signed)
Subjective:  Patient ID: Trevor Lee, male    DOB: 12-Apr-1991  Age: 29 y.o. MRN: [id number]  CC: Annual Exam   HPI Trevor Lee presents for annual exam. Trevor Lee is currently in a day program for exercise, etc. Pt. Has no complaints. Mom just wants him checked over and requests blood work to include drug levels. Trevor Lee has a history of seizures as a result of CP. Trevor Lee also has OCD and ADD. Trevor Lee has spastic hemiplegia involving the LUE primarily  Depression screen [facility] 2/9 07/30/2020 02/04/2018 08/05/2017  Decreased Interest 0 0 0  Down, Depressed, Hopeless 0 0 0  PHQ - 2 Score 0 0 0    History Trevor Lee has a past medical history of Autistic disorder and Seizures ([place]).   Trevor Lee has a past surgical history that includes Tendon transfer elbow (Left, December 2013).   His family history is not on file.Trevor Lee reports that Trevor Lee has never smoked. Trevor Lee has never used smokeless tobacco. Trevor Lee reports that Trevor Lee does not drink alcohol and does not use drugs.    ROS Review of Systems  Constitutional: Negative for chills, diaphoresis and fever.  HENT: Negative for congestion, ear pain, hearing loss, nosebleeds, sore throat and tinnitus.   Eyes: Negative.  Negative for photophobia, pain, discharge and redness.  Respiratory: Negative for cough, shortness of breath and wheezing.   Cardiovascular: Negative for chest pain, palpitations and leg swelling.  Gastrointestinal: Negative for abdominal pain, blood in stool, constipation, diarrhea, nausea and vomiting.  Endocrine: Negative for polydipsia.  Genitourinary: Negative for dysuria, flank pain, frequency, hematuria and urgency.  Musculoskeletal: Negative for back pain, myalgias and neck pain.  Skin: Negative for rash.  Allergic/Immunologic: Negative for environmental allergies.  Neurological: Negative for dizziness, tremors, seizures, weakness and headaches.  Hematological: Does not bruise/bleed easily.  Psychiatric/Behavioral: Negative.  Negative for  hallucinations and suicidal ideas. The patient is not nervous/anxious.     Objective:  BP 128/71   Pulse 69   Temp (!) 97 F (36.1 C) (Temporal)   Resp 20   Ht _0  (1.702 m)   Wt 208 lb 6 oz (94.5 kg)   SpO2 98%   BMI 32.64 kg/m   BP Readings from Last 3 Encounters:  07/30/20 128/71  02/22/20 122/80  02/04/18 125/75    Wt Readings from Last 3 Encounters:  07/30/20 208 lb 6 oz (94.5 kg)  02/22/20 205 lb 9.6 oz (93.3 kg)  02/04/18 205 lb 6 oz (93.2 kg)     Physical Exam Vitals reviewed.  Constitutional:      Appearance: Trevor Lee is well-developed.  HENT:     Head: Normocephalic and atraumatic.     Right Ear: Tympanic membrane and external ear normal. No decreased hearing noted.     Left Ear: Tympanic membrane and external ear normal. No decreased hearing noted.     Mouth/Throat:     Pharynx: No oropharyngeal exudate or posterior oropharyngeal erythema.  Eyes:     Pupils: Pupils are equal, round, and reactive to light.  Cardiovascular:     Rate and Rhythm: Normal rate and regular rhythm.     Heart sounds: No murmur heard.   Pulmonary:     Effort: No respiratory distress.     Breath sounds: Normal breath sounds.  Abdominal:     General: Bowel sounds are normal.     Palpations: Abdomen is soft. There is no mass.     Tenderness: There is no abdominal tenderness.  Musculoskeletal:  Cervical back: Normal range of motion and neck supple.       Assessment & Plan:   Trevor Lee was seen today for annual exam.  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 I am also having him maintain his Intuniv, Sodium Fluoride 5000 PPM, Omega-3, chlorhexidine, TEGretol-XR, divalproex, castor oil, and multivitamin with minerals.  Allergies as of 07/30/2020   No Known Allergies     Medication List       Accurate as of July 30, 2020 10:39 PM. If you have any questions, ask your nurse or doctor.        STOP taking these medications   amoxicillin-clavulanate 875-125 MG tablet Commonly known as: AUGMENTIN Stopped by: Claretta Fraise, MD   predniSONE 10 MG tablet Commonly known as: DELTASONE Stopped by: Claretta Fraise, MD     TAKE these medications   castor oil liquid Take by mouth daily as needed for moderate constipation.   chlorhexidine 0.12 % solution Commonly known as: PERIDEX RINSE WITH 1/2 OZ TWICE A WEEK BEFORE BEDTIME   divalproex 500 MG DR tablet Commonly known as: DEPAKOTE TAKE 1 TABLET BY MOUTH IN THE MORNING, TAKE 1 TABLET AT MID-DAY, AND 2 TABLETS AT BEDTIME   Intuniv 4 MG Tb24 ER tablet Generic drug: guanFACINE   multivitamin with minerals tablet Take 1 tablet by mouth daily.   Omega-3 1000 MG Caps Take by mouth.   Sodium Fluoride 5000 PPM 1.1 % Pste Generic drug: Sodium Fluoride USE THIN RIBBON OF PASTE ON TOOTHBRUSH AT BEDTIME. DO NOT RINSE   TEGretol-XR 100 MG 12 hr tablet Generic drug: carbamazepine TAKE 2 TABLETS BY MOUTH EVERY MORNING TAKE 1 TABLET AT NOON,AND TAKE 2 TABLETS AT BEDTIME        Follow-up: Return in about 6 months (around 01/27/2021).  Claretta Fraise, M.D.

## 2020-08-05 ENCOUNTER — Other Ambulatory Visit: Payer: Self-pay

## 2020-08-05 ENCOUNTER — Other Ambulatory Visit: Payer: BC Managed Care – PPO

## 2020-08-05 DIAGNOSIS — F902 Attention-deficit hyperactivity disorder, combined type: Secondary | ICD-10-CM | POA: Diagnosis not present

## 2020-08-05 DIAGNOSIS — F422 Mixed obsessional thoughts and acts: Secondary | ICD-10-CM | POA: Diagnosis not present

## 2020-08-05 DIAGNOSIS — G40409 Other generalized epilepsy and epileptic syndromes, not intractable, without status epilepticus: Secondary | ICD-10-CM | POA: Diagnosis not present

## 2020-08-05 DIAGNOSIS — Z0000 Encounter for general adult medical examination without abnormal findings: Secondary | ICD-10-CM | POA: Diagnosis not present

## 2020-08-05 DIAGNOSIS — E782 Mixed hyperlipidemia: Secondary | ICD-10-CM | POA: Diagnosis not present

## 2020-08-05 DIAGNOSIS — G808 Other cerebral palsy: Secondary | ICD-10-CM | POA: Diagnosis not present

## 2020-08-05 LAB — URINALYSIS
Bilirubin, UA: NEGATIVE
Glucose, UA: NEGATIVE
Ketones, UA: NEGATIVE
Leukocytes,UA: NEGATIVE
Nitrite, UA: NEGATIVE
Protein,UA: NEGATIVE
RBC, UA: NEGATIVE
Specific Gravity, UA: 1.025 (ref 1.005–1.030)
Urobilinogen, Ur: 0.2 mg/dL (ref 0.2–1.0)
pH, UA: 6.5 (ref 5.0–7.5)

## 2020-08-05 NOTE — Progress Notes (Signed)
Hello Lisa, ? ?Your lab result is normal and/or stable.Some minor variations that are not significant are commonly marked abnormal, but do not represent any medical problem for you. ? ?Best regards, ?Temika Sutphin, M.D.

## 2020-08-06 LAB — CMP14+EGFR
ALT: 15 IU/L (ref 0–44)
AST: 14 IU/L (ref 0–40)
Albumin/Globulin Ratio: 1.9 (ref 1.2–2.2)
Albumin: 4.3 g/dL (ref 4.1–5.2)
Alkaline Phosphatase: 59 IU/L (ref 44–121)
BUN/Creatinine Ratio: 25 — ABNORMAL HIGH (ref 9–20)
BUN: 18 mg/dL (ref 6–20)
Bilirubin Total: 0.4 mg/dL (ref 0.0–1.2)
CO2: 25 mmol/L (ref 20–29)
Calcium: 9.5 mg/dL (ref 8.7–10.2)
Chloride: 101 mmol/L (ref 96–106)
Creatinine, Ser: 0.72 mg/dL — ABNORMAL LOW (ref 0.76–1.27)
GFR calc Af Amer: 148 mL/min/{1.73_m2} (ref >59)
GFR calc non Af Amer: 128 mL/min/{1.73_m2} (ref >59)
Globulin, Total: 2.3 g/dL (ref 1.5–4.5)
Glucose: 84 mg/dL (ref 65–99)
Potassium: 4.5 mmol/L (ref 3.5–5.2)
Sodium: 141 mmol/L (ref 134–144)
Total Protein: 6.6 g/dL (ref 6.0–8.5)

## 2020-08-06 LAB — CBC WITH DIFFERENTIAL/PLATELET
Basophils Absolute: 0.1 10*3/uL (ref 0.0–0.2)
Basos: 1 %
EOS (ABSOLUTE): 0.3 10*3/uL (ref 0.0–0.4)
Eos: 5 %
Hematocrit: 45.7 % (ref 37.5–51.0)
Hemoglobin: 16.1 g/dL (ref 13.0–17.7)
Immature Grans (Abs): 0 10*3/uL (ref 0.0–0.1)
Immature Granulocytes: 1 %
Lymphocytes Absolute: 4 10*3/uL — ABNORMAL HIGH (ref 0.7–3.1)
Lymphs: 56 %
MCH: 33.1 pg — ABNORMAL HIGH (ref 26.6–33.0)
MCHC: 35.2 g/dL (ref 31.5–35.7)
MCV: 94 fL (ref 79–97)
Monocytes Absolute: 0.6 10*3/uL (ref 0.1–0.9)
Monocytes: 8 %
Neutrophils Absolute: 2.1 10*3/uL (ref 1.4–7.0)
Neutrophils: 29 %
Platelets: 286 10*3/uL (ref 150–450)
RBC: 4.87 x10E6/uL (ref 4.14–5.80)
RDW: 11.9 % (ref 11.6–15.4)
WBC: 7.2 10*3/uL (ref 3.4–10.8)

## 2020-08-06 LAB — LIPID PANEL
Chol/HDL Ratio: 2.6 ratio (ref 0.0–5.0)
Cholesterol, Total: 159 mg/dL (ref 100–199)
HDL: 61 mg/dL (ref >39)
LDL Chol Calc (NIH): 79 mg/dL (ref 0–99)
Triglycerides: 107 mg/dL (ref 0–149)
VLDL Cholesterol Cal: 19 mg/dL (ref 5–40)

## 2020-08-06 LAB — CARBAMAZEPINE LEVEL, TOTAL: Carbamazepine (Tegretol), S: 7 ug/mL (ref 4.0–12.0)

## 2020-08-06 LAB — VALPROIC ACID LEVEL: Valproic Acid Lvl: 67 ug/mL (ref 50–100)

## 2020-08-06 NOTE — Progress Notes (Signed)
Hello Robyn, ? ?Your lab result is normal and/or stable.Some minor variations that are not significant are commonly marked abnormal, but do not represent any medical problem for you. ? ?Best regards, ?Destenee Guerry, M.D.

## 2020-08-27 DIAGNOSIS — F840 Autistic disorder: Secondary | ICD-10-CM | POA: Diagnosis not present

## 2020-08-27 DIAGNOSIS — F71 Moderate intellectual disabilities: Secondary | ICD-10-CM | POA: Diagnosis not present

## 2020-08-27 DIAGNOSIS — F900 Attention-deficit hyperactivity disorder, predominantly inattentive type: Secondary | ICD-10-CM | POA: Diagnosis not present

## 2020-08-27 DIAGNOSIS — F428 Other obsessive-compulsive disorder: Secondary | ICD-10-CM | POA: Diagnosis not present

## 2020-11-20 DIAGNOSIS — F900 Attention-deficit hyperactivity disorder, predominantly inattentive type: Secondary | ICD-10-CM | POA: Diagnosis not present

## 2020-11-20 DIAGNOSIS — F71 Moderate intellectual disabilities: Secondary | ICD-10-CM | POA: Diagnosis not present

## 2020-11-20 DIAGNOSIS — F840 Autistic disorder: Secondary | ICD-10-CM | POA: Diagnosis not present

## 2020-11-20 DIAGNOSIS — F428 Other obsessive-compulsive disorder: Secondary | ICD-10-CM | POA: Diagnosis not present

## 2021-01-12 ENCOUNTER — Encounter (INDEPENDENT_AMBULATORY_CARE_PROVIDER_SITE_OTHER): Payer: Self-pay

## 2021-01-27 ENCOUNTER — Ambulatory Visit (INDEPENDENT_AMBULATORY_CARE_PROVIDER_SITE_OTHER): Payer: BC Managed Care – PPO | Admitting: Family Medicine

## 2021-01-27 ENCOUNTER — Other Ambulatory Visit: Payer: Self-pay

## 2021-01-27 ENCOUNTER — Encounter: Payer: Self-pay | Admitting: Family Medicine

## 2021-01-27 VITALS — BP 121/80 | HR 71 | Temp 98.0°F | Ht 67.0 in | Wt 207.6 lb

## 2021-01-27 DIAGNOSIS — G40409 Other generalized epilepsy and epileptic syndromes, not intractable, without status epilepticus: Secondary | ICD-10-CM

## 2021-01-27 DIAGNOSIS — F79 Unspecified intellectual disabilities: Secondary | ICD-10-CM

## 2021-01-27 DIAGNOSIS — R4689 Other symptoms and signs involving appearance and behavior: Secondary | ICD-10-CM | POA: Diagnosis not present

## 2021-01-27 DIAGNOSIS — G808 Other cerebral palsy: Secondary | ICD-10-CM

## 2021-01-27 NOTE — Progress Notes (Signed)
Subjective:  Patient ID: Trevor Lee, male    DOB: 05-26-1991  Age: 30 y.o. MRN: [id number]  CC: Follow-up   HPI Trevor Lee presents for check up.   Depression screen [facility] 2/9 01/27/2021 07/30/2020 02/04/2018  Decreased Interest 0 0 0  Down, Depressed, Hopeless 0 0 0  PHQ - 2 Score 0 0 0    History Trevor Lee has a past medical history of Autistic disorder and Seizures ([place]).   He has a past surgical history that includes Tendon transfer elbow (Left, December 2013).   His family history is not on file.He reports that he has never smoked. He has never used smokeless tobacco. He reports that he does not drink alcohol and does not use drugs.    ROS Review of Systems  Constitutional: Negative.   HENT: Negative.   Eyes: Negative for visual disturbance.  Respiratory: Negative for cough and shortness of breath.   Cardiovascular: Negative for chest pain and leg swelling.  Gastrointestinal: Negative for abdominal pain, diarrhea, nausea and vomiting.  Genitourinary: Negative for difficulty urinating.  Musculoskeletal: Negative for arthralgias and myalgias.  Skin: Negative for rash.  Neurological: Negative for headaches.  Psychiatric/Behavioral: Negative for sleep disturbance.    Objective:  BP 121/80   Pulse 71   Temp 98 F (36.7 C)   Ht 5\' 7"  (1.702 m)   Wt 207 lb 9.6 oz (94.2 kg)   SpO2 96%   BMI 32.51 kg/m   BP Readings from Last 3 Encounters:  01/27/21 121/80  07/30/20 128/71  02/22/20 122/80    Wt Readings from Last 3 Encounters:  01/27/21 207 lb 9.6 oz (94.2 kg)  07/30/20 208 lb 6 oz (94.5 kg)  02/22/20 205 lb 9.6 oz (93.3 kg)     Physical Exam Vitals reviewed.  Constitutional:      Appearance: He is well-developed.  HENT:     Head: Normocephalic and atraumatic.     Right Ear: Tympanic membrane and external ear normal. No decreased hearing noted.     Left Ear: Tympanic membrane and external ear normal. No decreased hearing noted.      Mouth/Throat:     Pharynx: No oropharyngeal exudate or posterior oropharyngeal erythema.  Eyes:     Pupils: Pupils are equal, round, and reactive to light.  Cardiovascular:     Rate and Rhythm: Normal rate and regular rhythm.     Heart sounds: No murmur heard.   Pulmonary:     Effort: No respiratory distress.     Breath sounds: Normal breath sounds.  Abdominal:     General: Bowel sounds are normal.     Palpations: Abdomen is soft. There is no mass.     Tenderness: There is no abdominal tenderness.  Musculoskeletal:     Cervical back: Normal range of motion and neck supple.       Assessment & Plan:   Trevor Lee was seen today for follow-up.  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  Allergies as of 01/27/2021   No Known Allergies     Medication List       Accurate as of Jan 27, 2021 10:16 PM. If you have any questions, ask your nurse or doctor.  castor oil liquid Take by mouth daily as needed for moderate constipation.   chlorhexidine 0.12 % solution Commonly known as: PERIDEX RINSE WITH 1/2 OZ TWICE A WEEK BEFORE BEDTIME   divalproex 500 MG DR tablet Commonly known as: DEPAKOTE TAKE 1 TABLET BY MOUTH IN THE MORNING, TAKE 1 TABLET AT MID-DAY, AND 2 TABLETS AT BEDTIME   Intuniv 4 MG Tb24 ER tablet Generic drug: guanFACINE   multivitamin with minerals tablet Take 1 tablet by mouth daily.   Omega-3 1000 MG Caps Take by mouth.   Sodium Fluoride 5000 PPM 1.1 % Pste Generic drug: Sodium Fluoride USE THIN RIBBON OF PASTE ON TOOTHBRUSH AT BEDTIME. DO NOT RINSE   TEGretol-XR 100 MG 12 hr tablet Generic drug:  carbamazepine TAKE 2 TABLETS BY MOUTH EVERY MORNING TAKE 1 TABLET AT NOON,AND TAKE 2 TABLETS AT BEDTIME        Follow-up: Return in about 6 months (around 07/30/2021) for [facility] physical.  Claretta Fraise, M.D.

## 2021-02-17 DIAGNOSIS — F428 Other obsessive-compulsive disorder: Secondary | ICD-10-CM | POA: Diagnosis not present

## 2021-02-17 DIAGNOSIS — F71 Moderate intellectual disabilities: Secondary | ICD-10-CM | POA: Diagnosis not present

## 2021-02-17 DIAGNOSIS — F840 Autistic disorder: Secondary | ICD-10-CM | POA: Diagnosis not present

## 2021-02-17 DIAGNOSIS — F900 Attention-deficit hyperactivity disorder, predominantly inattentive type: Secondary | ICD-10-CM | POA: Diagnosis not present

## 2021-02-24 ENCOUNTER — Telehealth (INDEPENDENT_AMBULATORY_CARE_PROVIDER_SITE_OTHER): Payer: Self-pay | Admitting: Family

## 2021-02-24 DIAGNOSIS — G40119 Localization-related (focal) (partial) symptomatic epilepsy and epileptic syndromes with simple partial seizures, intractable, without status epilepticus: Secondary | ICD-10-CM

## 2021-02-24 DIAGNOSIS — G40109 Localization-related (focal) (partial) symptomatic epilepsy and epileptic syndromes with simple partial seizures, not intractable, without status epilepticus: Secondary | ICD-10-CM

## 2021-02-24 MED ORDER — TEGRETOL-XR 100 MG PO TB12: ORAL_TABLET | ORAL | 0 refills | Status: DC

## 2021-02-24 NOTE — Addendum Note (Signed)
Addended by: Joelyn Oms on: 02/24/2021 03:45 PM   Modules accepted: Orders

## 2021-02-24 NOTE — Telephone Encounter (Signed)
Please escribe

## 2021-02-24 NOTE — Telephone Encounter (Signed)
  Who's calling (name and relationship to patient) :mom/ Gwendlyn Deutscher contact number:[phone]  Provider they RFF:[facility]   Reason for call:medication Refill      PRESCRIPTION REFILL ONLY  Name of prescription:TEGRETOL   Ph[facility]

## 2021-02-28 ENCOUNTER — Other Ambulatory Visit (INDEPENDENT_AMBULATORY_CARE_PROVIDER_SITE_OTHER): Payer: Self-pay | Admitting: Family

## 2021-02-28 DIAGNOSIS — G40109 Localization-related (focal) (partial) symptomatic epilepsy and epileptic syndromes with simple partial seizures, not intractable, without status epilepticus: Secondary | ICD-10-CM

## 2021-02-28 DIAGNOSIS — G40119 Localization-related (focal) (partial) symptomatic epilepsy and epileptic syndromes with simple partial seizures, intractable, without status epilepticus: Secondary | ICD-10-CM

## 2021-02-28 MED ORDER — TEGRETOL-XR 100 MG PO TB12: ORAL_TABLET | ORAL | 0 refills | Status: DC

## 2021-03-27 ENCOUNTER — Encounter (INDEPENDENT_AMBULATORY_CARE_PROVIDER_SITE_OTHER): Payer: Self-pay | Admitting: Family

## 2021-03-27 ENCOUNTER — Other Ambulatory Visit: Payer: Self-pay

## 2021-03-27 ENCOUNTER — Ambulatory Visit (INDEPENDENT_AMBULATORY_CARE_PROVIDER_SITE_OTHER): Payer: BC Managed Care – PPO | Admitting: Family

## 2021-03-27 VITALS — BP 126/84 | HR 92 | Wt 207.8 lb

## 2021-03-27 DIAGNOSIS — G40409 Other generalized epilepsy and epileptic syndromes, not intractable, without status epilepticus: Secondary | ICD-10-CM

## 2021-03-27 DIAGNOSIS — F422 Mixed obsessional thoughts and acts: Secondary | ICD-10-CM

## 2021-03-27 DIAGNOSIS — G808 Other cerebral palsy: Secondary | ICD-10-CM | POA: Diagnosis not present

## 2021-03-27 DIAGNOSIS — F902 Attention-deficit hyperactivity disorder, combined type: Secondary | ICD-10-CM | POA: Diagnosis not present

## 2021-03-27 DIAGNOSIS — F802 Mixed receptive-expressive language disorder: Secondary | ICD-10-CM

## 2021-03-27 DIAGNOSIS — F79 Unspecified intellectual disabilities: Secondary | ICD-10-CM

## 2021-03-27 DIAGNOSIS — R269 Unspecified abnormalities of gait and mobility: Secondary | ICD-10-CM

## 2021-03-27 DIAGNOSIS — R4689 Other symptoms and signs involving appearance and behavior: Secondary | ICD-10-CM

## 2021-03-27 DIAGNOSIS — R482 Apraxia: Secondary | ICD-10-CM

## 2021-03-27 DIAGNOSIS — G8114 Spastic hemiplegia affecting left nondominant side: Secondary | ICD-10-CM | POA: Diagnosis not present

## 2021-03-27 NOTE — Progress Notes (Signed)
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 Trevor Lee is taking and tolerating Tegretol DR and generic Divalproex for his seizure disorder and has remained seizure free since September 2016.  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 Trevor Lee has had problems with his mood and with compulsive behaviors in the past, but has had problems with tolerance.   Today's concerns: Mom reports today that Trevor Lee has remained seizure free since his last visit. He lives with his mother and visits his father often. Mom notes that he is sometimes more clingy and anxious, particularly in public places, and that his psychiatrist is working with him about this.  Trevor Lee used to have a supervised job before the [employer] pandemic but has been unable to secure employment since then.   Mom has questions regarding changing his prescription from brand Tegretol to a generic formulation to save money.   Trevor Lee has been otherwise generally healthy since he was last seen. Mom has no other health concerns for him today other than previously mentioned.  Review of systems: Please see HPI for neurologic and other pertinent review of systems. Otherwise all other systems were reviewed and were negative.  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   Social history: Social History   Socioeconomic History   Marital status: Single    Spouse name: Not on file   Number of children: Not on file   Years of education: Not on file   Highest education level: Not on file  Occupational History   Not on file  Tobacco Use   Smoking status: Never   Smokeless tobacco: Never  Vaping Use   Vaping Use: Never used  Substance and Sexual Activity   Alcohol use: No   Drug use: No   Sexual activity: Never  Other Topics Concern   Not on file  Social History Narrative   He graduated from [employer] where he was in a self contained class setting. He has had difficulty with attention span, and difficulty keeping up with the pace of regular classes. Trevor Lee is now in enrolled in a day program 5 days per week. He enjoys riding  his bike, [employer] basketball, listening to music, computer and playing music. He is employed at the [employer] where he does [employer].   Social Determinants of Health   Financial Resource Strain: Not on file  Food Insecurity: Not on file  Transportation Needs: Not on file  Physical Activity: Not on file  Stress: Not on file  Social Connections: Not on  file  Intimate Partner Violence: Not on file    Past/failed meds: Copied from previous record: Abilify - weight gain Vyvanse - tachycardia CBD oil - no improvement in behavior  Allergies: No Known Allergies   Immunizations: Immunization History  Administered Date(s) Administered   DTaP 04/25/1993, 06/26/1993, 08/26/1993, 06/08/1994, 03/25/1998   Hepatitis A 12/09/2005, 03/03/2007   Hepatitis B 05-09-1993, 04/25/1993, 08/26/1993   HiB (PRP-OMP) 04/25/1993, 06/26/1993, 08/26/1993, 06/08/1994   IPV 04/25/1993, 06/26/1993, 08/26/1993, 04/18/1998   Influenza Inj Mdck Quad Pf 06/07/2018   Influenza,inj,Quad PF,6+ Mos 07/11/2015, 06/10/2017, 05/15/2020   Influenza-Unspecified 06/07/2018   MMR 06/08/1994, 03/25/1998   Meningococcal Conjugate 12/09/2005   Td 10/09/2003, 07/11/2015   Tdap 07/11/2015   Varicella 05/04/1995    Diagnostics/Screenings: Copied from previous record:  Physical Exam: BP 126/84   Pulse 92   Wt 207 lb 12.8 oz (94.3 kg)   BMI 32.55 kg/m   General: Well developed, well nourished man, seated in exam room, in no evident distress,  brown hair, brown eyes, right handed Head: Head normocephalic and atraumatic.  Oropharynx benign. Neck: Supple Cardiovascular: Regular rate and rhythm, no murmurs Respiratory: Breath sounds clear to auscultation Musculoskeletal: Contractures of the left arm and leg with left wrist drop, clawhand deformity, decreased range of motion at the left elbow. Wears left AFO.  Skin: No rashes or neurocutaneous lesions  Neurologic Exam Mental Status: Awake and fully alert.  Oriented to place and time. Attention span, concentration, and fund of knowledge subnormal for age. Speech with mild dysarthria. Leans on this mother and strokes her arm frequently. Impulsive and interrupts frequently. Needs frequent redirection.  Cranial Nerves: Fundoscopic exam reveals red reflex. Pupils equal, briskly reactive to light. Turns to localize faces, objects  and sounds in the periphery. Facial sensation intact.  Face tongue, palate move normally and symmetrically.  Shoulder shrug asymmetric. Motor: Normal functional bulk, tone and strength on the right, significant clumsiness and weakness on the left, with left arm and hand contractures. Sensory: Intact to touch and temperature in all extremities.  Coordination: Unable to adequately assess due to patient's inability to cooperate with examination. No dysmetria when reaching for objects.  Gait and Station: Arises from chair without difficulty. Has left hemiparetic gait and decreased left arm swing. Reflexes: 1+ and symmetric.   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 Trevor Lee has neither had nor required imaging or lab studies since the last visit. He is a 30 year old man with history of congenital left hemiparesis, seizures, language disorder, intellectual disability, anxiety, ADHD, and obsessive compulsive disorder. He is taking and tolerating brand Tegretol XR and generic Divalproex, and has remained seizure free since September 2016. Mom had questions regarding changing the Tegretol to generic formulation and I talked  with her about that. I recommended checking Tegretol levels before and after switching if she decides to make that change. Mom will think about it and let me know. I encouraged continued close follow up with [place] psychiatrist. I will see him back in follow up in 1 year or sooner if needed.   The medication list was  reviewed and reconciled. No changes were made in the prescribed medications today. A complete medication list was provided to the patient.  Return in about 1 year (around 03/27/2022).   Allergies as of 03/27/2021   No Known Allergies      Medication List        Accurate as of March 27, 2021 11:59 PM. If you have any questions, ask your nurse or doctor.          STOP taking these medications    castor oil liquid Stopped by: Trevor Germany, NP   Omega-3 1000 MG Caps Stopped by: Trevor Germany, NP       TAKE these medications    chlorhexidine 0.12 % solution Commonly known as: PERIDEX RINSE WITH 1/2 OZ TWICE A WEEK BEFORE BEDTIME   divalproex 500 MG DR tablet Commonly known as: DEPAKOTE TAKE 1 TABLET BY MOUTH IN THE MORNING, TAKE 1 TABLET AT MID-DAY, AND 2 TABLETS AT BEDTIME   FLUoxetine 20 MG capsule Commonly known as: PROZAC Take 20 mg by mouth daily.   Intuniv 4 MG Tb24 ER tablet Generic drug: guanFACINE   multivitamin with minerals tablet Take 1 tablet by mouth daily.   Sodium Fluoride 5000 PPM 1.1 % Pste Generic drug: Sodium Fluoride USE THIN RIBBON OF PASTE ON TOOTHBRUSH AT BEDTIME. DO NOT RINSE   TEGretol-XR 100 MG 12 hr tablet Generic drug: carbamazepine TAKE 2 TABLETS BY MOUTH EVERY MORNING TAKE 1 TABLET AT NOON,AND TAKE 2 TABLETS AT BEDTIME        Total time spent with the patient was 20 minutes, of which 50% or more was spent in counseling and coordination of care.  Trevor Germany NP-C [facility] Ph. [phone] Fax [phone]

## 2021-03-30 ENCOUNTER — Encounter (INDEPENDENT_AMBULATORY_CARE_PROVIDER_SITE_OTHER): Payer: Self-pay | Admitting: Family

## 2021-03-30 NOTE — Patient Instructions (Signed)
Thank you for coming in today.   Instructions for you until your next appointment are as follows: Continue [name]'s medication as prescribed If you decide to switch to generic Tegretol formulation let me know and I will order lab studies as we discussed today Continue follow up with [place] psychiatrist as you have been doing Please sign up for MyChart if you have not done so. Please plan to return for follow up in one year or sooner if needed.  At [facility], we are committed to providing exceptional care. You will receive a patient satisfaction survey through text or email regarding your visit today. Your opinion is important to me. Comments are appreciated.

## 2021-04-16 ENCOUNTER — Other Ambulatory Visit (INDEPENDENT_AMBULATORY_CARE_PROVIDER_SITE_OTHER): Payer: Self-pay | Admitting: Family

## 2021-04-16 DIAGNOSIS — G40409 Other generalized epilepsy and epileptic syndromes, not intractable, without status epilepticus: Secondary | ICD-10-CM

## 2021-05-15 DIAGNOSIS — F428 Other obsessive-compulsive disorder: Secondary | ICD-10-CM | POA: Diagnosis not present

## 2021-05-15 DIAGNOSIS — F840 Autistic disorder: Secondary | ICD-10-CM | POA: Diagnosis not present

## 2021-05-15 DIAGNOSIS — F900 Attention-deficit hyperactivity disorder, predominantly inattentive type: Secondary | ICD-10-CM | POA: Diagnosis not present

## 2021-05-15 DIAGNOSIS — F71 Moderate intellectual disabilities: Secondary | ICD-10-CM | POA: Diagnosis not present

## 2021-05-20 ENCOUNTER — Encounter (INDEPENDENT_AMBULATORY_CARE_PROVIDER_SITE_OTHER): Payer: Self-pay | Admitting: Family

## 2021-05-30 ENCOUNTER — Other Ambulatory Visit (INDEPENDENT_AMBULATORY_CARE_PROVIDER_SITE_OTHER): Payer: Self-pay | Admitting: Pediatrics

## 2021-05-30 DIAGNOSIS — G40109 Localization-related (focal) (partial) symptomatic epilepsy and epileptic syndromes with simple partial seizures, not intractable, without status epilepticus: Secondary | ICD-10-CM

## 2021-05-30 DIAGNOSIS — G40119 Localization-related (focal) (partial) symptomatic epilepsy and epileptic syndromes with simple partial seizures, intractable, without status epilepticus: Secondary | ICD-10-CM

## 2021-08-25 DIAGNOSIS — F900 Attention-deficit hyperactivity disorder, predominantly inattentive type: Secondary | ICD-10-CM | POA: Diagnosis not present

## 2021-08-25 DIAGNOSIS — F428 Other obsessive-compulsive disorder: Secondary | ICD-10-CM | POA: Diagnosis not present

## 2021-08-25 DIAGNOSIS — F71 Moderate intellectual disabilities: Secondary | ICD-10-CM | POA: Diagnosis not present

## 2021-08-25 DIAGNOSIS — F840 Autistic disorder: Secondary | ICD-10-CM | POA: Diagnosis not present

## 2021-11-19 DIAGNOSIS — F71 Moderate intellectual disabilities: Secondary | ICD-10-CM | POA: Diagnosis not present

## 2021-11-19 DIAGNOSIS — F900 Attention-deficit hyperactivity disorder, predominantly inattentive type: Secondary | ICD-10-CM | POA: Diagnosis not present

## 2021-11-19 DIAGNOSIS — F428 Other obsessive-compulsive disorder: Secondary | ICD-10-CM | POA: Diagnosis not present

## 2021-11-19 DIAGNOSIS — F840 Autistic disorder: Secondary | ICD-10-CM | POA: Diagnosis not present

## 2021-11-30 ENCOUNTER — Other Ambulatory Visit (INDEPENDENT_AMBULATORY_CARE_PROVIDER_SITE_OTHER): Payer: Self-pay | Admitting: Neurology

## 2021-11-30 DIAGNOSIS — G40119 Localization-related (focal) (partial) symptomatic epilepsy and epileptic syndromes with simple partial seizures, intractable, without status epilepticus: Secondary | ICD-10-CM

## 2021-11-30 DIAGNOSIS — G40109 Localization-related (focal) (partial) symptomatic epilepsy and epileptic syndromes with simple partial seizures, not intractable, without status epilepticus: Secondary | ICD-10-CM

## 2021-12-01 ENCOUNTER — Telehealth (INDEPENDENT_AMBULATORY_CARE_PROVIDER_SITE_OTHER): Payer: Self-pay | Admitting: Family

## 2021-12-01 NOTE — Telephone Encounter (Signed)
The Rx was faxed to the pharmacy. TG ?

## 2021-12-01 NOTE — Telephone Encounter (Signed)
?  Name of who is calling:Kelly  ? ?Caller's Relationship to Patient: grandmother  ? ?Best contact number:[phone] ? ?Provider they WGN:FAOZ Goodpasture  ? ?Reason for call: need refill  ? ? ? ? ?PRESCRIPTION REFILL ONLY ? ?Name of prescription: Tegretol  ? ?Pharmacy: [facility]  ? ? ?

## 2021-12-03 ENCOUNTER — Encounter: Payer: Self-pay | Admitting: Family Medicine

## 2021-12-03 ENCOUNTER — Ambulatory Visit (INDEPENDENT_AMBULATORY_CARE_PROVIDER_SITE_OTHER): Payer: BC Managed Care – PPO | Admitting: Family Medicine

## 2021-12-03 VITALS — BP 122/77 | HR 69 | Temp 97.5°F | Ht 67.0 in | Wt 212.2 lb

## 2021-12-03 DIAGNOSIS — G808 Other cerebral palsy: Secondary | ICD-10-CM

## 2021-12-03 DIAGNOSIS — Z0000 Encounter for general adult medical examination without abnormal findings: Secondary | ICD-10-CM | POA: Diagnosis not present

## 2021-12-03 DIAGNOSIS — E782 Mixed hyperlipidemia: Secondary | ICD-10-CM

## 2021-12-03 DIAGNOSIS — G40409 Other generalized epilepsy and epileptic syndromes, not intractable, without status epilepticus: Secondary | ICD-10-CM

## 2021-12-03 DIAGNOSIS — Z0001 Encounter for general adult medical examination with abnormal findings: Secondary | ICD-10-CM

## 2021-12-03 LAB — URINALYSIS
Bilirubin, UA: NEGATIVE
Glucose, UA: NEGATIVE
Leukocytes,UA: NEGATIVE
Nitrite, UA: NEGATIVE
Protein,UA: NEGATIVE
RBC, UA: NEGATIVE
Specific Gravity, UA: 1.025 (ref 1.005–1.030)
Urobilinogen, Ur: 0.2 mg/dL (ref 0.2–1.0)
pH, UA: 6 (ref 5.0–7.5)

## 2021-12-03 NOTE — Progress Notes (Signed)
? ?Subjective:  ?Patient ID: Trevor Lee, male    DOB: [birth year]  Age: 31 y.o. MRN: [id number] ? ?CC: Annual Exam ? ? ?HPI ?Trevor Lee presents for Annual physical.  ? 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 He exhibits OCD signs frequently ans sees psychiatry for tratment.  ? ? ?  12/03/2021  ?  2:02 PM 12/03/2021  ?  1:51 PM 01/27/2021  ?  4:34 PM  ?Depression screen PHQ 2/9  ?Decreased Interest 0 0 0  ?Down, Depressed, Hopeless 0 0 0  ?PHQ - 2 Score 0 0 0  ?Altered sleeping 1    ?Tired, decreased energy 1    ?Change in appetite 3    ?Feeling bad or failure about yourself  0    ?Trouble concentrating 0    ?Moving slowly or fidgety/restless 0    ?Suicidal thoughts 0    ?PHQ-9 Score 5    ?Difficult doing work/chores Not difficult at all    ? ? ?History ?Trevor Lee has a past medical history of Autistic disorder and Seizures (Trevor Lee).  ? ?He has a past surgical history that includes Tendon transfer elbow (Left, December 2013).  ? ?His family history is not on file.He reports that he has never smoked. He has never used smokeless tobacco. He reports that he does not drink alcohol and does not use drugs. ? ? ? ?ROS ?Review of Systems  ?Constitutional:  Negative for activity change, fatigue and unexpected weight change.  ?HENT:  Negative for congestion, ear pain, hearing loss, postnasal drip and trouble swallowing.   ?     Cold sore of lip at angle on left.  ?Eyes:  Negative for pain and visual disturbance.  ?Respiratory:  Negative for cough, chest tightness and shortness of breath.   ?Cardiovascular:  Negative for chest pain, palpitations and leg swelling.  ?Gastrointestinal:  Negative for abdominal distention, abdominal pain, blood in stool, constipation, diarrhea, nausea and vomiting.  ?Endocrine: Negative for cold intolerance, heat intolerance and polydipsia.  ?Genitourinary:  Negative for difficulty urinating, dysuria, flank pain, frequency and  urgency.  ?Musculoskeletal:  Negative for arthralgias and joint swelling.  ?Skin:  Negative for color change, rash and wound.  ?Neurological:  Negative for dizziness, syncope, speech difficulty, weakness, light-headedness, numbness and headaches.  ?Hematological:  Does not bruise/bleed easily.  ?Psychiatric/Behavioral:  Negative for confusion, decreased concentration, dysphoric mood and sleep disturbance. The patient is not nervous/anxious.   ? ?Objective:  ?BP 122/77   Pulse 69   Temp (!) 97.5 ?F (36.4 ?C)   Ht '5\' 7"'  (1.702 m)   Wt 212 lb 3.2 oz (96.3 kg)   SpO2 97%   BMI 33.24 kg/m?  ? ?BP Readings from Last 3 Encounters:  ?12/03/21 122/77  ?03/27/21 126/84  ?01/27/21 121/80  ? ? ?Wt Readings from Last 3 Encounters:  ?12/03/21 212 lb 3.2 oz (96.3 kg)  ?03/27/21 207 lb 12.8 oz (94.3 kg)  ?01/27/21 207 lb 9.6 oz (94.2 kg)  ? ? ? ?Physical Exam ?Constitutional:   ?   Appearance: He is well-developed.  ?HENT:  ?   Head: Normocephalic and atraumatic.  ?Eyes:  ?   Pupils: Pupils are equal, round, and reactive to light.  ?Neck:  ?   Thyroid: No thyromegaly.  ?   Trachea: No tracheal deviation.  ?Cardiovascular:  ?   Rate and Rhythm: Normal rate and regular rhythm.  ?   Heart sounds: Normal heart  sounds. No murmur heard. ?  No friction rub. No gallop.  ?Pulmonary:  ?   Breath sounds: Normal breath sounds. No wheezing or rales.  ?Abdominal:  ?   General: Bowel sounds are normal. There is no distension.  ?   Palpations: Abdomen is soft. There is no mass.  ?   Tenderness: There is no abdominal tenderness.  ?   Hernia: There is no hernia in the left inguinal area.  ?Genitourinary: ?   Penis: Normal.   ?   Testes: Normal.  ?Musculoskeletal:     ?   General: Normal range of motion.  ?   Cervical back: Normal range of motion.  ?Lymphadenopathy:  ?   Cervical: No cervical adenopathy.  ?Skin: ?   General: Skin is warm and dry.  ?Neurological:  ?   Mental Status: He is alert and oriented to person, place, and time.  ?    Coordination: Coordination abnormal (left arm has deformity for flexion at elbow, wrist and fingers on left).  ? ? ? ? ?Assessment & Plan:  ? ?Trevor Lee was seen today for annual exam. ? ?Diagnoses and all orders for this visit: ? ?Well adult exam ?-     CBC with Differential/Platelet ?-     CMP14+EGFR ?-     Lipid panel ?-     Urinalysis ? ?Mixed hyperlipidemia ?-     Lipid panel ? 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 ?Congenital hemiplegia (Trevor Lee) ? ? ? ? ? ? ?I am having Trevor Lee maintain his Intuniv, Sodium Fluoride 5000 PPM, chlorhexidine, multivitamin with minerals, divalproex, TEGretol-XR, and FLUoxetine. ? ?Allergies as of 12/03/2021   ?No Known Allergies ?  ? ?  ?Medication List  ?  ? ?  ? Accurate as of December 03, 2021 11:59 PM. If you have any questions, ask your nurse or doctor.  ?  ?  ? ?  ? ?chlorhexidine 0.12 % solution ?Commonly known as: PERIDEX ?RINSE WITH 1/2 OZ TWICE A WEEK BEFORE BEDTIME ?  ?divalproex 500 MG DR tablet ?Commonly known as: DEPAKOTE ?TAKE 1 TABLET BY MOUTH IN THE MORNING, TAKE 1 TABLET AT MID-DAY, AND 2 TABLETS AT BEDTIME ?  ?FLUoxetine 40 MG capsule ?Commonly known as: PROZAC ?Take 40 mg by mouth daily. ?What changed: Another medication with the same name was removed. Continue taking this medication, and follow the directions you see here. ?Changed by: Claretta Fraise, MD ?  ?Intuniv 4 MG Tb24 ER tablet ?Generic drug: guanFACINE ?  ?multivitamin with minerals tablet ?Take 1 tablet by mouth daily. ?  ?Sodium Fluoride 5000 PPM 1.1 % Pste ?Generic drug: Sodium Fluoride ?USE THIN RIBBON OF PASTE ON TOOTHBRUSH AT BEDTIME. DO NOT RINSE ?  ?TEGretol-XR 100 MG 12 hr tablet ?Generic drug: carbamazepine ?TAKE 2 TABLETS BY MOUTH IN THE MORNING , 1 TAB AT NOON, 2 TAB AT BEDTIME ?  ? ?  ? ? ? ?Follow-up: Return in about 1 year (around 12/04/2022), or if symptoms worsen or fail to improve, for [facility] physical. ? ?Claretta Fraise, M.D. ?

## 2021-12-04 LAB — CBC WITH DIFFERENTIAL/PLATELET
Basophils Absolute: 0.1 10*3/uL (ref 0.0–0.2)
Basos: 1 %
EOS (ABSOLUTE): 0.2 10*3/uL (ref 0.0–0.4)
Eos: 2 %
Hematocrit: 47.7 % (ref 37.5–51.0)
Hemoglobin: 16.6 g/dL (ref 13.0–17.7)
Immature Grans (Abs): 0 10*3/uL (ref 0.0–0.1)
Immature Granulocytes: 0 %
Lymphocytes Absolute: 4.4 10*3/uL — ABNORMAL HIGH (ref 0.7–3.1)
Lymphs: 46 %
MCH: 31.9 pg (ref 26.6–33.0)
MCHC: 34.8 g/dL (ref 31.5–35.7)
MCV: 92 fL (ref 79–97)
Monocytes Absolute: 0.9 10*3/uL (ref 0.1–0.9)
Monocytes: 10 %
Neutrophils Absolute: 3.8 10*3/uL (ref 1.4–7.0)
Neutrophils: 41 %
Platelets: 289 10*3/uL (ref 150–450)
RBC: 5.2 x10E6/uL (ref 4.14–5.80)
RDW: 11.8 % (ref 11.6–15.4)
WBC: 9.4 10*3/uL (ref 3.4–10.8)

## 2021-12-04 LAB — CMP14+EGFR
ALT: 15 IU/L (ref 0–44)
AST: 12 IU/L (ref 0–40)
Albumin/Globulin Ratio: 2.3 — ABNORMAL HIGH (ref 1.2–2.2)
Albumin: 4.9 g/dL (ref 4.1–5.2)
Alkaline Phosphatase: 67 IU/L (ref 44–121)
BUN/Creatinine Ratio: 31 — ABNORMAL HIGH (ref 9–20)
BUN: 20 mg/dL (ref 6–20)
Bilirubin Total: <0.2 mg/dL (ref 0.0–1.2)
CO2: 26 mmol/L (ref 20–29)
Calcium: 9.8 mg/dL (ref 8.7–10.2)
Chloride: 101 mmol/L (ref 96–106)
Creatinine, Ser: 0.65 mg/dL — ABNORMAL LOW (ref 0.76–1.27)
Globulin, Total: 2.1 g/dL (ref 1.5–4.5)
Glucose: 83 mg/dL (ref 70–99)
Potassium: 4.3 mmol/L (ref 3.5–5.2)
Sodium: 141 mmol/L (ref 134–144)
Total Protein: 7 g/dL (ref 6.0–8.5)
eGFR: 132 mL/min/{1.73_m2} (ref >59)

## 2021-12-04 LAB — LIPID PANEL
Chol/HDL Ratio: 3 ratio (ref 0.0–5.0)
Cholesterol, Total: 177 mg/dL (ref 100–199)
HDL: 59 mg/dL (ref >39)
LDL Chol Calc (NIH): 87 mg/dL (ref 0–99)
Triglycerides: 184 mg/dL — ABNORMAL HIGH (ref 0–149)
VLDL Cholesterol Cal: 31 mg/dL (ref 5–40)

## 2021-12-05 ENCOUNTER — Encounter: Payer: Self-pay | Admitting: Family Medicine

## 2021-12-05 NOTE — Progress Notes (Signed)
Hello Trevor Lee, ? ?Your lab result is normal and/or stable.Some minor variations that are not significant are commonly marked abnormal, but do not represent any medical problem for you. ? ?Best regards, ?Claretta Fraise, M.D.

## 2022-02-17 DIAGNOSIS — F840 Autistic disorder: Secondary | ICD-10-CM | POA: Diagnosis not present

## 2022-02-17 DIAGNOSIS — F71 Moderate intellectual disabilities: Secondary | ICD-10-CM | POA: Diagnosis not present

## 2022-02-17 DIAGNOSIS — F428 Other obsessive-compulsive disorder: Secondary | ICD-10-CM | POA: Diagnosis not present

## 2022-02-17 DIAGNOSIS — F900 Attention-deficit hyperactivity disorder, predominantly inattentive type: Secondary | ICD-10-CM | POA: Diagnosis not present

## 2022-03-05 ENCOUNTER — Other Ambulatory Visit (INDEPENDENT_AMBULATORY_CARE_PROVIDER_SITE_OTHER): Payer: Self-pay | Admitting: Family

## 2022-03-05 DIAGNOSIS — G40109 Localization-related (focal) (partial) symptomatic epilepsy and epileptic syndromes with simple partial seizures, not intractable, without status epilepticus: Secondary | ICD-10-CM

## 2022-03-05 DIAGNOSIS — G40119 Localization-related (focal) (partial) symptomatic epilepsy and epileptic syndromes with simple partial seizures, intractable, without status epilepticus: Secondary | ICD-10-CM

## 2022-04-01 ENCOUNTER — Other Ambulatory Visit (INDEPENDENT_AMBULATORY_CARE_PROVIDER_SITE_OTHER): Payer: Self-pay | Admitting: Family

## 2022-04-01 ENCOUNTER — Encounter (INDEPENDENT_AMBULATORY_CARE_PROVIDER_SITE_OTHER): Payer: Self-pay | Admitting: Family

## 2022-04-01 ENCOUNTER — Ambulatory Visit (INDEPENDENT_AMBULATORY_CARE_PROVIDER_SITE_OTHER): Payer: BC Managed Care – PPO | Admitting: Family

## 2022-04-01 VITALS — BP 118/74 | HR 86 | Ht 67.87 in | Wt 213.2 lb

## 2022-04-01 DIAGNOSIS — F79 Unspecified intellectual disabilities: Secondary | ICD-10-CM | POA: Diagnosis not present

## 2022-04-01 DIAGNOSIS — G40409 Other generalized epilepsy and epileptic syndromes, not intractable, without status epilepticus: Secondary | ICD-10-CM | POA: Diagnosis not present

## 2022-04-01 DIAGNOSIS — F802 Mixed receptive-expressive language disorder: Secondary | ICD-10-CM

## 2022-04-01 DIAGNOSIS — G8114 Spastic hemiplegia affecting left nondominant side: Secondary | ICD-10-CM

## 2022-04-01 DIAGNOSIS — G40119 Localization-related (focal) (partial) symptomatic epilepsy and epileptic syndromes with simple partial seizures, intractable, without status epilepticus: Secondary | ICD-10-CM

## 2022-04-01 DIAGNOSIS — R482 Apraxia: Secondary | ICD-10-CM

## 2022-04-01 DIAGNOSIS — R269 Unspecified abnormalities of gait and mobility: Secondary | ICD-10-CM

## 2022-04-01 DIAGNOSIS — G40109 Localization-related (focal) (partial) symptomatic epilepsy and epileptic syndromes with simple partial seizures, not intractable, without status epilepticus: Secondary | ICD-10-CM

## 2022-04-01 NOTE — Patient Instructions (Signed)
It was a pleasure to see you today!  Instructions for you until your next appointment are as follows: Continue giving Trevor Lee's medication as prescribed Let me know if you want to try generic Tegretol Please sign up for MyChart if you have not done so. Please plan to return for follow up in one year or sooner if needed.   Feel free to contact our office during normal business hours at [phone] with questions or concerns. If there is no answer or the call is outside business hours, please leave a message and our clinic staff will call you back within the next business day.  If you have an urgent concern, please stay on the line for our after-hours answering service and ask for the on-call neurologist.     I also encourage you to use MyChart to communicate with me more directly. If you have not yet signed up for MyChart within [facility], the front desk staff can help you. However, please note that this inbox is NOT monitored on nights or weekends, and response can take up to 2 business days.  Urgent matters should be discussed with the on-call pediatric neurologist.   At [facility], we are committed to providing exceptional care. You will receive a patient satisfaction survey through text or email regarding your visit today. Your opinion is important to me. Comments are appreciated.

## 2022-04-01 NOTE — Progress Notes (Unsigned)
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 Trevor Lee is taking and tolerating Tegretol DR and generic Divalproex for his seizure disorder and has remained seizure free since September 2016.  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 Trevor Lee has had problems with his mood and with compulsive behaviors in the past, but has had problems with tolerance.   Today's concerns: Mom reports that Trevor Lee has remained seizure free since his last visit. He is active with his family and recently attended a street festival with them.   Mom remains interested in trying generic Tegretol but is very concerned about the possibility of breakthrough seizures.   Trevor Lee has been otherwise generally healthy since he was last seen. Mom has no other health concerns for him today other than previously mentioned.  Review of systems: Please see HPI for neurologic and other pertinent review of systems. Otherwise all other systems were reviewed and were negative.  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   Social history: Social History   Socioeconomic History   Marital status: Single    Spouse name: Not on file   Number of children: Not on file   Years of education: Not on file   Highest education level: Not on file  Occupational History   Not on file  Tobacco Use   Smoking status: Never   Smokeless tobacco: Never  Vaping Use   Vaping Use: Never used  Substance and Sexual Activity   Alcohol use: No   Drug use: No   Sexual activity: Never  Other Topics Concern   Not on file  Social History Narrative   ** Merged History Encounter **       He graduated from [employer] where he was in a self contained class setting. He has had difficulty with attention span, and difficulty keeping up with the pace of regular classes. Trevor Lee is now in enrolled in a day program 5 days per week. He en   joys riding his bike, [employer] basketball, listening to music, computer and playing music. He is employed at the [employer] where he does [employer].  Social Determinants of Health   Financial Resource Strain: Not on file  Food Insecurity: Not on file  Transportation Needs: Not on file  Physical Activity: Not on file  Stress: Not on file  Social Connections: Not on file  Intimate Partner Violence: Not on file    Past/failed meds: Copied from previous record: Abilify - weight gain Vyvanse - tachycardia CBD oil - no improvement in behavior    Allergies: No Known Allergies   Immunizations: Immunization History  Administered Date(s) Administered   DTaP 04/25/1993, 06/26/1993, 08/26/1993, 06/08/1994, 03/25/1998   Hepatitis A 12/09/2005, 03/03/2007   Hepatitis B 10-18-1992, 04/25/1993, 08/26/1993   HiB (PRP-OMP) 04/25/1993, 06/26/1993, 08/26/1993, 06/08/1994   IPV 04/25/1993, 06/26/1993, 08/26/1993, 04/18/1998   Influenza Inj Mdck Quad Pf 06/07/2018   Influenza,inj,Quad PF,6+ Mos 07/11/2015, 06/10/2017, 05/15/2020   Influenza-Unspecified 06/07/2018   MMR 06/08/1994, 03/25/1998   Meningococcal Conjugate 12/09/2005   Td 10/09/2003, 07/11/2015   Tdap 07/11/2015   Varicella 05/04/1995    Diagnostics/Screenings:   Physical Exam: BP 118/74   Pulse 86   Ht 5' 7.87" (1.724 m)   Wt 213 lb 3.2 oz (96.7 kg)   BMI 32.54 kg/m   General: Well developed, well nourished man, seated in the exam room with this mother, in no evident distress Head: Head normocephalic and atraumatic.  Oropharynx benign. Neck: Supple Cardiovascular: Regular rate and rhythm, no murmurs Respiratory: Breath sounds clear to auscultation Musculoskeletal: Has contractures of the left arm and leg with left wrist drop, clawhand deformity and decreased range of motion at the elbow. Wears left AFO. Skin: No rashes or neurocutaneous lesions  Neurologic Exam Mental Status: Awake and fully alert.  Oriented to place and time.  Attention span, concentration, and fund of knowledge subnormal for age. Speech with mild dysarthria. Impulsive and interrupts frequently. Needs frequent redirection. Cranial Nerves: Fundoscopic exam reveals red reflex.  Pupils equal, briskly reactive to light. Turns to locate faces, objects and sounds in the periphery  Facial sensation intact.  Face tongue, palate move normally and symmetrically.  Motor: Normal functional bulk, tone and strength on the right. Significant clumsiness and weakness on the left, with left hand and arm contractures.   Sensory: Intact to touch and temperature in all extremities.  Coordination: Unable to adequately assess due to his inability to cooperate with examination. No dysmetria when reaching for objects.  Gait and Station: Arises from chair without difficulty.  Has left hemiparetic gait.  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 Trevor Lee has neither had nor required imaging or lab studies since the last visit. He has remained seizure free and compliant with medication since his last visit. He will continue on his medication without change. Mom will think about switching to generic Tegretol because of cost and will let me know if she decides to do so. I will otherwise see [name] back in follow up in 1 year or sooner if needed.   The medication list was reviewed and reconciled. No changes were made in the prescribed medications today. A complete medication list was provided to the patient.  Return in about 1 year (around 04/02/2023).   Allergies as of 04/01/2022   No Known Allergies      Medication List  Accurate as of April 01, 2022  8:40 AM. If you have any questions, ask your nurse or doctor.          chlorhexidine 0.12 % solution Commonly known as: PERIDEX RINSE WITH 1/2 OZ TWICE A WEEK BEFORE BEDTIME   divalproex 500 MG DR tablet Commonly known as: DEPAKOTE TAKE 1 TABLET BY MOUTH IN THE MORNING, TAKE 1 TABLET AT MID-DAY, AND 2 TABLETS AT BEDTIME   FLUoxetine 40 MG capsule Commonly known as: PROZAC Take 40 mg by mouth daily.   Intuniv 4 MG Tb24 ER tablet Generic drug: guanFACINE   multivitamin with minerals tablet Take 1 tablet by mouth daily.   Sodium  Fluoride 5000 PPM 1.1 % Pste Generic drug: Sodium Fluoride USE THIN RIBBON OF PASTE ON TOOTHBRUSH AT BEDTIME. DO NOT RINSE   TEGretol-XR 100 MG 12 hr tablet Generic drug: carbamazepine TAKE 2 TABLETS BY MOUTH IN THE MORNING, 1 TAB AT NOON, 2 TABS AT BEDTIME      Total time spent with the patient was 20 minutes, of which 50% or more was spent in counseling and coordination of care.  Rockwell Germany NP-C [facility] Ph. [phone] Fax [phone]

## 2022-04-02 ENCOUNTER — Encounter (INDEPENDENT_AMBULATORY_CARE_PROVIDER_SITE_OTHER): Payer: Self-pay | Admitting: Family

## 2022-04-02 DIAGNOSIS — G8114 Spastic hemiplegia affecting left nondominant side: Secondary | ICD-10-CM | POA: Insufficient documentation

## 2022-04-12 ENCOUNTER — Other Ambulatory Visit (INDEPENDENT_AMBULATORY_CARE_PROVIDER_SITE_OTHER): Payer: Self-pay | Admitting: Family

## 2022-04-12 DIAGNOSIS — G40409 Other generalized epilepsy and epileptic syndromes, not intractable, without status epilepticus: Secondary | ICD-10-CM

## 2022-05-18 DIAGNOSIS — F840 Autistic disorder: Secondary | ICD-10-CM | POA: Diagnosis not present

## 2022-05-18 DIAGNOSIS — F71 Moderate intellectual disabilities: Secondary | ICD-10-CM | POA: Diagnosis not present

## 2022-05-18 DIAGNOSIS — F900 Attention-deficit hyperactivity disorder, predominantly inattentive type: Secondary | ICD-10-CM | POA: Diagnosis not present

## 2022-05-18 DIAGNOSIS — F428 Other obsessive-compulsive disorder: Secondary | ICD-10-CM | POA: Diagnosis not present

## 2022-08-11 DIAGNOSIS — F840 Autistic disorder: Secondary | ICD-10-CM | POA: Diagnosis not present

## 2022-08-11 DIAGNOSIS — F900 Attention-deficit hyperactivity disorder, predominantly inattentive type: Secondary | ICD-10-CM | POA: Diagnosis not present

## 2022-08-11 DIAGNOSIS — F71 Moderate intellectual disabilities: Secondary | ICD-10-CM | POA: Diagnosis not present

## 2022-08-11 DIAGNOSIS — F428 Other obsessive-compulsive disorder: Secondary | ICD-10-CM | POA: Diagnosis not present

## 2022-09-14 ENCOUNTER — Other Ambulatory Visit (INDEPENDENT_AMBULATORY_CARE_PROVIDER_SITE_OTHER): Payer: Self-pay | Admitting: Neurology

## 2022-09-14 DIAGNOSIS — G40409 Other generalized epilepsy and epileptic syndromes, not intractable, without status epilepticus: Secondary | ICD-10-CM

## 2022-09-14 NOTE — Telephone Encounter (Signed)
Next OV: (Due: 04-02-2023), Not yet scheduled.  Previous OV: 04-01-2022.  Plan: To return in 1 yr.  Forwarded to provider for review and completion.  B. Roten CMA

## 2022-09-28 ENCOUNTER — Encounter (INDEPENDENT_AMBULATORY_CARE_PROVIDER_SITE_OTHER): Payer: Self-pay

## 2022-09-28 ENCOUNTER — Other Ambulatory Visit (INDEPENDENT_AMBULATORY_CARE_PROVIDER_SITE_OTHER): Payer: Self-pay | Admitting: Family

## 2022-09-28 DIAGNOSIS — G40119 Localization-related (focal) (partial) symptomatic epilepsy and epileptic syndromes with simple partial seizures, intractable, without status epilepticus: Secondary | ICD-10-CM

## 2022-09-28 DIAGNOSIS — G40109 Localization-related (focal) (partial) symptomatic epilepsy and epileptic syndromes with simple partial seizures, not intractable, without status epilepticus: Secondary | ICD-10-CM

## 2022-09-28 NOTE — Telephone Encounter (Signed)
Seen 04/01/22 RTC in 1 yr not scheduled- my chart message sent to remind to sched. Med filled 03/2022 with 5 rf.

## 2022-11-05 DIAGNOSIS — F428 Other obsessive-compulsive disorder: Secondary | ICD-10-CM | POA: Diagnosis not present

## 2022-11-05 DIAGNOSIS — F840 Autistic disorder: Secondary | ICD-10-CM | POA: Diagnosis not present

## 2022-11-05 DIAGNOSIS — F900 Attention-deficit hyperactivity disorder, predominantly inattentive type: Secondary | ICD-10-CM | POA: Diagnosis not present

## 2022-11-05 DIAGNOSIS — F71 Moderate intellectual disabilities: Secondary | ICD-10-CM | POA: Diagnosis not present

## 2022-12-07 ENCOUNTER — Encounter: Payer: BC Managed Care – PPO | Admitting: Family Medicine

## 2022-12-29 DIAGNOSIS — Z6834 Body mass index (BMI) 34.0-34.9, adult: Secondary | ICD-10-CM | POA: Diagnosis not present

## 2022-12-29 DIAGNOSIS — H66003 Acute suppurative otitis media without spontaneous rupture of ear drum, bilateral: Secondary | ICD-10-CM | POA: Diagnosis not present

## 2023-01-08 ENCOUNTER — Other Ambulatory Visit (INDEPENDENT_AMBULATORY_CARE_PROVIDER_SITE_OTHER): Payer: Self-pay | Admitting: Family

## 2023-01-08 DIAGNOSIS — G40409 Other generalized epilepsy and epileptic syndromes, not intractable, without status epilepticus: Secondary | ICD-10-CM

## 2023-01-18 ENCOUNTER — Ambulatory Visit (INDEPENDENT_AMBULATORY_CARE_PROVIDER_SITE_OTHER): Payer: BC Managed Care – PPO | Admitting: Family Medicine

## 2023-01-18 ENCOUNTER — Encounter: Payer: Self-pay | Admitting: Family Medicine

## 2023-01-18 VITALS — BP 121/78 | HR 87 | Temp 98.2°F | Ht 67.87 in | Wt 211.0 lb

## 2023-01-18 DIAGNOSIS — Z0000 Encounter for general adult medical examination without abnormal findings: Secondary | ICD-10-CM | POA: Diagnosis not present

## 2023-01-18 DIAGNOSIS — Z0001 Encounter for general adult medical examination with abnormal findings: Secondary | ICD-10-CM | POA: Diagnosis not present

## 2023-01-18 DIAGNOSIS — E782 Mixed hyperlipidemia: Secondary | ICD-10-CM | POA: Diagnosis not present

## 2023-01-18 LAB — URINALYSIS
Bilirubin, UA: NEGATIVE
Glucose, UA: NEGATIVE
Leukocytes,UA: NEGATIVE
Nitrite, UA: NEGATIVE
Protein,UA: NEGATIVE
RBC, UA: NEGATIVE
Specific Gravity, UA: 1.02 (ref 1.005–1.030)
Urobilinogen, Ur: 0.2 mg/dL (ref 0.2–1.0)
pH, UA: 7 (ref 5.0–7.5)

## 2023-01-18 NOTE — Progress Notes (Signed)
Subjective:  Patient ID: Trevor Lee, male    DOB: [birth year]  Age: 32 y.o. MRN: [id number]  CC: Annual Exam   HPI Trevor Lee presents for annual physical. He has had no seizures. Continues meds as below since last exam.     01/18/2023    3:02 PM 12/03/2021    2:02 PM 12/03/2021    1:51 PM  Depression screen PHQ 2/9  Decreased Interest 0 0 0  Down, Depressed, Hopeless 0 0 0  PHQ - 2 Score 0 0 0  Altered sleeping  1   Tired, decreased energy  1   Change in appetite  3   Feeling bad or failure about yourself   0   Trouble concentrating  0   Moving slowly or fidgety/restless  0   Suicidal thoughts  0   PHQ-9 Score  5   Difficult doing work/chores  Not difficult at all     History Trevor Lee has a past medical history of Autistic disorder and Seizures (HCC).   He has a past surgical history that includes Tendon transfer elbow (Left, December 2013).   His family history is not on file.He reports that he has never smoked. He has never used smokeless tobacco. He reports that he does not drink alcohol and does not use drugs.    ROS Review of Systems  Constitutional:  Negative for activity change, fatigue and unexpected weight change.  HENT:  Negative for congestion, ear pain, hearing loss, postnasal drip and trouble swallowing.   Eyes:  Negative for pain and visual disturbance.  Respiratory:  Negative for cough, chest tightness and shortness of breath.   Cardiovascular:  Negative for chest pain, palpitations and leg swelling.  Gastrointestinal:  Negative for abdominal distention, abdominal pain, blood in stool, constipation, diarrhea, nausea and vomiting.  Endocrine: Negative for cold intolerance, heat intolerance and polydipsia.  Genitourinary:  Negative for difficulty urinating, dysuria, flank pain, frequency and urgency.  Musculoskeletal:  Negative for arthralgias and joint swelling.  Skin:  Negative for color change, rash and wound.  Neurological:  Negative for  dizziness, syncope, speech difficulty, weakness, light-headedness, numbness and headaches.  Hematological:  Does not bruise/bleed easily.  Psychiatric/Behavioral:  Negative for confusion, decreased concentration, dysphoric mood and sleep disturbance. The patient is not nervous/anxious.     Objective:  BP 121/78   Pulse 87   Temp 98.2 F (36.8 C)   Ht 5' 7.87" (1.724 m)   Wt 211 lb (95.7 kg)   SpO2 97%   BMI 32.21 kg/m   BP Readings from Last 3 Encounters:  01/18/23 121/78  04/01/22 118/74  12/03/21 122/77    Wt Readings from Last 3 Encounters:  01/18/23 211 lb (95.7 kg)  04/01/22 213 lb 3.2 oz (96.7 kg)  12/03/21 212 lb 3.2 oz (96.3 kg)     Physical Exam Constitutional:      Appearance: He is well-developed.  HENT:     Head: Normocephalic and atraumatic.  Eyes:     Pupils: Pupils are equal, round, and reactive to light.  Neck:     Thyroid: No thyromegaly.     Trachea: No tracheal deviation.  Cardiovascular:     Rate and Rhythm: Normal rate and regular rhythm.     Heart sounds: Normal heart sounds. No murmur heard.    No friction rub. No gallop.  Pulmonary:     Breath sounds: Normal breath sounds. No wheezing or rales.  Abdominal:     General: Bowel sounds  are normal. There is no distension.     Palpations: Abdomen is soft. There is no mass.     Tenderness: There is no abdominal tenderness.     Hernia: There is no hernia in the left inguinal area or right inguinal area.  Genitourinary:    Penis: Normal.      Testes: Normal.  Musculoskeletal:        General: Normal range of motion.     Cervical back: Normal range of motion.  Lymphadenopathy:     Cervical: No cervical adenopathy.  Skin:    General: Skin is warm and dry.  Neurological:     Mental Status: He is alert and oriented to person, place, and time.     Motor: Weakness (LUE) present.     Coordination: Coordination abnormal (LUE).  Psychiatric:        Mood and Affect: Mood normal.        Behavior:  Behavior normal.       Assessment & Plan:   Trevor Lee was seen today for annual exam.  Diagnoses and all orders for this visit:  Well adult exam -     CMP14+EGFR -     Lipid panel -     CBC with Differential/Platelet -     Urinalysis  Mixed hyperlipidemia -     Lipid panel       I am having Trevor Lee maintain his Intuniv, Sodium Fluoride 5000 PPM, chlorhexidine, multivitamin with minerals, FLUoxetine, TEGretol-XR, and divalproex.  Allergies as of 01/18/2023   No Known Allergies      Medication List        Accurate as of Jan 18, 2023  5:41 PM. If you have any questions, ask your nurse or doctor.          chlorhexidine 0.12 % solution Commonly known as: PERIDEX RINSE WITH 1/2 OZ TWICE A WEEK BEFORE BEDTIME   divalproex 500 MG DR tablet Commonly known as: DEPAKOTE TAKE 1 TABLET BY MOUTH IN THE MORNING, TAKE 1 TABLET AT MID-DAY, AND 2 TABLETS AT BEDTIME   FLUoxetine 40 MG capsule Commonly known as: PROZAC Take 40 mg by mouth daily.   Intuniv 4 MG Tb24 ER tablet Generic drug: guanFACINE   multivitamin with minerals tablet Take 1 tablet by mouth daily.   Sodium Fluoride 5000 PPM 1.1 % Pste Generic drug: Sodium Fluoride USE THIN RIBBON OF PASTE ON TOOTHBRUSH AT BEDTIME. DO NOT RINSE   TEGretol-XR 100 MG 12 hr tablet Generic drug: carbamazepine TAKE 2 TABLETS BY MOUTH EVERY MORNING PLUS 1 TAB AT NOON AND 2 TABS AT BEDTIME         Follow-up: Return in about 1 year (around 01/18/2024).  Mechele Claude, M.D.

## 2023-01-19 LAB — CBC WITH DIFFERENTIAL/PLATELET
Basophils Absolute: 0.1 10*3/uL (ref 0.0–0.2)
Basos: 1 %
EOS (ABSOLUTE): 0.3 10*3/uL (ref 0.0–0.4)
Eos: 4 %
Hematocrit: 46.7 % (ref 37.5–51.0)
Hemoglobin: 16.4 g/dL (ref 13.0–17.7)
Immature Grans (Abs): 0 10*3/uL (ref 0.0–0.1)
Immature Granulocytes: 0 %
Lymphocytes Absolute: 4.4 10*3/uL — ABNORMAL HIGH (ref 0.7–3.1)
Lymphs: 49 %
MCH: 32.1 pg (ref 26.6–33.0)
MCHC: 35.1 g/dL (ref 31.5–35.7)
MCV: 91 fL (ref 79–97)
Monocytes Absolute: 0.7 10*3/uL (ref 0.1–0.9)
Monocytes: 8 %
Neutrophils Absolute: 3.4 10*3/uL (ref 1.4–7.0)
Neutrophils: 38 %
Platelets: 298 10*3/uL (ref 150–450)
RBC: 5.11 x10E6/uL (ref 4.14–5.80)
RDW: 11.9 % (ref 11.6–15.4)
WBC: 8.9 10*3/uL (ref 3.4–10.8)

## 2023-01-19 LAB — CMP14+EGFR
ALT: 10 IU/L (ref 0–44)
AST: 13 IU/L (ref 0–40)
Albumin/Globulin Ratio: 1.8 (ref 1.2–2.2)
Albumin: 4.6 g/dL (ref 4.3–5.2)
Alkaline Phosphatase: 69 IU/L (ref 44–121)
BUN/Creatinine Ratio: 24 — ABNORMAL HIGH (ref 9–20)
BUN: 17 mg/dL (ref 6–20)
Bilirubin Total: <0.2 mg/dL (ref 0.0–1.2)
CO2: 24 mmol/L (ref 20–29)
Calcium: 9.9 mg/dL (ref 8.7–10.2)
Chloride: 101 mmol/L (ref 96–106)
Creatinine, Ser: 0.7 mg/dL — ABNORMAL LOW (ref 0.76–1.27)
Globulin, Total: 2.6 g/dL (ref 1.5–4.5)
Glucose: 86 mg/dL (ref 70–99)
Potassium: 4.5 mmol/L (ref 3.5–5.2)
Sodium: 141 mmol/L (ref 134–144)
Total Protein: 7.2 g/dL (ref 6.0–8.5)
eGFR: 128 mL/min/{1.73_m2} (ref >59)

## 2023-01-19 LAB — LIPID PANEL
Chol/HDL Ratio: 3.3 ratio (ref 0.0–5.0)
Cholesterol, Total: 192 mg/dL (ref 100–199)
HDL: 59 mg/dL (ref >39)
LDL Chol Calc (NIH): 102 mg/dL — ABNORMAL HIGH (ref 0–99)
Triglycerides: 178 mg/dL — ABNORMAL HIGH (ref 0–149)
VLDL Cholesterol Cal: 31 mg/dL (ref 5–40)

## 2023-01-20 NOTE — Progress Notes (Signed)
Hello Nole,  Your lab result is normal and/or stable.Some minor variations that are not significant are commonly marked abnormal, but do not represent any medical problem for you.  Best regards, Emmanuel Gruenhagen, M.D.

## 2023-01-27 DIAGNOSIS — F71 Moderate intellectual disabilities: Secondary | ICD-10-CM | POA: Diagnosis not present

## 2023-01-27 DIAGNOSIS — F428 Other obsessive-compulsive disorder: Secondary | ICD-10-CM | POA: Diagnosis not present

## 2023-01-27 DIAGNOSIS — F840 Autistic disorder: Secondary | ICD-10-CM | POA: Diagnosis not present

## 2023-01-27 DIAGNOSIS — F900 Attention-deficit hyperactivity disorder, predominantly inattentive type: Secondary | ICD-10-CM | POA: Diagnosis not present

## 2023-02-03 ENCOUNTER — Telehealth (INDEPENDENT_AMBULATORY_CARE_PROVIDER_SITE_OTHER): Payer: Self-pay | Admitting: Family

## 2023-02-03 DIAGNOSIS — G40119 Localization-related (focal) (partial) symptomatic epilepsy and epileptic syndromes with simple partial seizures, intractable, without status epilepticus: Secondary | ICD-10-CM

## 2023-02-03 DIAGNOSIS — G40109 Localization-related (focal) (partial) symptomatic epilepsy and epileptic syndromes with simple partial seizures, not intractable, without status epilepticus: Secondary | ICD-10-CM

## 2023-02-03 MED ORDER — TEGRETOL-XR 100 MG PO TB12: ORAL_TABLET | ORAL | 1 refills | Status: DC

## 2023-02-03 NOTE — Telephone Encounter (Signed)
Who's calling (name and relationship to patient) :Tresa Endo- Emergency contact   Best contact number:[phone]   Provider they ZOX:[id number]   Reason for call: Tresa Endo called in stating that there is a shortage in [place] and the only store that has it is the [facility] on [address]. Tresa Endo is asking for the prescription to be sent over to the [facility].   Call ID:      PRESCRIPTION REFILL ONLY  Name of prescription:TEGRETOL-XR 100 MG 12 hr tablet   Pharmacy:[facility]- [address], [place], [place] [address]

## 2023-02-16 ENCOUNTER — Telehealth (INDEPENDENT_AMBULATORY_CARE_PROVIDER_SITE_OTHER): Payer: Self-pay | Admitting: Family

## 2023-02-16 ENCOUNTER — Other Ambulatory Visit (INDEPENDENT_AMBULATORY_CARE_PROVIDER_SITE_OTHER): Payer: Self-pay | Admitting: Family

## 2023-02-16 DIAGNOSIS — G40109 Localization-related (focal) (partial) symptomatic epilepsy and epileptic syndromes with simple partial seizures, not intractable, without status epilepticus: Secondary | ICD-10-CM

## 2023-02-16 DIAGNOSIS — G40119 Localization-related (focal) (partial) symptomatic epilepsy and epileptic syndromes with simple partial seizures, intractable, without status epilepticus: Secondary | ICD-10-CM

## 2023-02-16 DIAGNOSIS — Z79899 Other long term (current) drug therapy: Secondary | ICD-10-CM

## 2023-02-16 MED ORDER — CARBAMAZEPINE ER 100 MG PO TB12: ORAL_TABLET | ORAL | 1 refills | Status: DC

## 2023-02-16 NOTE — Telephone Encounter (Signed)
Who's calling (name and relationship to patient) : Trevor Lee; mom   Best contact number: [phone]   Provider they see: Elvia Collum  Reason for call: Mom was calling tegretol xr 100 mg, mom stated that she has called around and that the 2 people that she has spoken to stated that they wont have it in stock until June 15th. He will be completely on Wednesday after 2nd dose. She wants to know what she can do, maybe a generic brand.

## 2023-02-16 NOTE — Telephone Encounter (Signed)
I called and spoke with Mom. I recommended switch to generic Carbamazepine ER and checking blood level in 1 week. Mom agreed with this plan. TG

## 2023-02-25 DIAGNOSIS — G40119 Localization-related (focal) (partial) symptomatic epilepsy and epileptic syndromes with simple partial seizures, intractable, without status epilepticus: Secondary | ICD-10-CM | POA: Diagnosis not present

## 2023-02-25 DIAGNOSIS — Z79899 Other long term (current) drug therapy: Secondary | ICD-10-CM | POA: Diagnosis not present

## 2023-02-26 LAB — CARBAMAZEPINE LEVEL, TOTAL: Carbamazepine Lvl: 7.3 mg/L (ref 4.0–12.0)

## 2023-03-11 ENCOUNTER — Other Ambulatory Visit (INDEPENDENT_AMBULATORY_CARE_PROVIDER_SITE_OTHER): Payer: Self-pay | Admitting: Family

## 2023-03-11 DIAGNOSIS — G40119 Localization-related (focal) (partial) symptomatic epilepsy and epileptic syndromes with simple partial seizures, intractable, without status epilepticus: Secondary | ICD-10-CM

## 2023-03-11 DIAGNOSIS — G40109 Localization-related (focal) (partial) symptomatic epilepsy and epileptic syndromes with simple partial seizures, not intractable, without status epilepticus: Secondary | ICD-10-CM

## 2023-03-12 NOTE — Telephone Encounter (Signed)
Last OV 04/01/2022 Next OV 04/14/2023 Last rx 02/16/23 30 d Refilled for 90 d no refills to last until appt

## 2023-04-06 ENCOUNTER — Other Ambulatory Visit (INDEPENDENT_AMBULATORY_CARE_PROVIDER_SITE_OTHER): Payer: Self-pay | Admitting: Family

## 2023-04-06 DIAGNOSIS — G40409 Other generalized epilepsy and epileptic syndromes, not intractable, without status epilepticus: Secondary | ICD-10-CM

## 2023-04-14 ENCOUNTER — Ambulatory Visit (INDEPENDENT_AMBULATORY_CARE_PROVIDER_SITE_OTHER): Payer: BC Managed Care – PPO | Admitting: Family

## 2023-04-14 ENCOUNTER — Encounter (INDEPENDENT_AMBULATORY_CARE_PROVIDER_SITE_OTHER): Payer: Self-pay | Admitting: Family

## 2023-04-14 VITALS — BP 118/76 | HR 68 | Ht 68.5 in | Wt 210.4 lb

## 2023-04-14 DIAGNOSIS — G40119 Localization-related (focal) (partial) symptomatic epilepsy and epileptic syndromes with simple partial seizures, intractable, without status epilepticus: Secondary | ICD-10-CM | POA: Diagnosis not present

## 2023-04-14 DIAGNOSIS — G8114 Spastic hemiplegia affecting left nondominant side: Secondary | ICD-10-CM

## 2023-04-14 DIAGNOSIS — F902 Attention-deficit hyperactivity disorder, combined type: Secondary | ICD-10-CM

## 2023-04-14 DIAGNOSIS — F422 Mixed obsessional thoughts and acts: Secondary | ICD-10-CM

## 2023-04-14 DIAGNOSIS — F79 Unspecified intellectual disabilities: Secondary | ICD-10-CM

## 2023-04-14 DIAGNOSIS — R269 Unspecified abnormalities of gait and mobility: Secondary | ICD-10-CM

## 2023-04-14 DIAGNOSIS — R482 Apraxia: Secondary | ICD-10-CM | POA: Diagnosis not present

## 2023-04-14 DIAGNOSIS — F802 Mixed receptive-expressive language disorder: Secondary | ICD-10-CM

## 2023-04-14 NOTE — Patient Instructions (Signed)
It was a pleasure to see you today!  Instructions for you until your next appointment are as follows: Continue [name]'s medications as prescribed Let me know if seizures occur or if you have any questions.  Please sign up for MyChart if you have not done so. Please plan to return for follow up in 1 year or sooner if needed.  Feel free to contact our office during normal business hours at [phone] with questions or concerns. If there is no answer or the call is outside business hours, please leave a message and our clinic staff will call you back within the next business day.  If you have an urgent concern, please stay on the line for our after-hours answering service and ask for the on-call neurologist.     I also encourage you to use MyChart to communicate with me more directly. If you have not yet signed up for MyChart within [facility], the front desk staff can help you. However, please note that this inbox is NOT monitored on nights or weekends, and response can take up to 2 business days.  Urgent matters should be discussed with the on-call pediatric neurologist.   At [facility], we are committed to providing exceptional care. You will receive a patient satisfaction survey through text or email regarding your visit today. Your opinion is important to me. Comments are appreciated.

## 2023-04-14 NOTE — Progress Notes (Signed)
Trevor Lee   MRN:  [id number]  [birth year]   Provider: Elveria Rising NP-C Location of Care: [facility]  Visit type: Return visit  Last visit: 04/01/2022  Referral source: Mechele Claude, MD History from: Epic chart and patient's mother  Brief history:  Copied from previous record: History of congential left hemiparesis, left hemiatrophy, partial seizures of right brain signature, 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 Trevor Lee is taking and tolerating Tegretol DR and generic Divalproex for his seizure disorder and has remained seizure free since September 2016.  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 Trevor Lee has had problems with his mood and with compulsive behaviors in the past, but has had problems with tolerance.   Today's concerns:  Taydem has been otherwise generally healthy since he was last seen. No health concerns today other than previously mentioned.  Review of systems: Please see HPI for neurologic and other pertinent review of systems. Otherwise all other systems were reviewed and were negative.  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   Social history: Social History   Socioeconomic History   Marital status: Single    Spouse name: Not on file   Number of children: Not on file   Years of education: Not on file   Highest education level: Not on file  Occupational History   Not on file  Tobacco Use   Smoking status: Never   Smokeless tobacco: Never  Vaping Use   Vaping status: Never Used  Substance and Sexual Activity   Alcohol use: No   Drug use: No   Sexual activity: Never  Other Topics Concern   Not on file  Social History Narrative   ** Merged History Encounter **       He graduated from [employer] where he was in a self contained class setting. He has had difficulty with attention span, and difficulty keeping up with the pace of regular classes. Trevor Lee is now in enrolled in a day program 5 days per week. He en   joys riding his bike, [employer] basketball, listening to music, computer and playing music. He is employed at the [employer] where he does [employer].   Social Determinants of Health   Financial Resource Strain: Not on file  Food Insecurity: Not on file  Transportation Needs: Not on file  Physical Activity: Not on file  Stress: Not on file  Social Connections:  Not on file  Intimate Partner Violence: Not on file      Past/failed meds: Copied from previous record: Abilify - weight gain Vyvanse - tachycardia CBD oil - no improvement in behavior  Allergies: No Known Allergies    Immunizations: Immunization History  Administered Date(s) Administered   DTaP 04/25/1993, 06/26/1993, 08/26/1993, 06/08/1994, 03/25/1998   HIB (PRP-OMP) 04/25/1993,  06/26/1993, 08/26/1993, 06/08/1994   Hepatitis A 12/09/2005, 03/03/2007   Hepatitis B 12/30/1992, 04/25/1993, 08/26/1993   IPV 04/25/1993, 06/26/1993, 08/26/1993, 04/18/1998   Influenza Inj Mdck Quad Pf 06/07/2018   Influenza,inj,Quad PF,6+ Mos 07/11/2015, 06/10/2017, 05/15/2020   Influenza-Unspecified 06/07/2018   MMR 06/08/1994, 03/25/1998   Meningococcal Conjugate 12/09/2005   Td 10/09/2003, 07/11/2015   Tdap 07/11/2015   Varicella 05/04/1995    Diagnostics/Screenings:   Physical Exam: BP 118/76   Pulse 68   Ht 5' 8.5" (1.74 m)   Wt 210 lb 6.4 oz (95.4 kg)   BMI 31.52 kg/m   General: well developed, well nourished, seated, in no evident distress Head: normocephalic and atraumatic. Oropharynx difficult to examine but appears benign. No dysmorphic features. Neck: supple Cardiovascular: regular rate and rhythm, no murmurs. Respiratory: clear to auscultation bilaterally Abdomen: bowel sounds present all four quadrants, abdomen soft, non-tender, non-distended. No hepatosplenomegaly or masses palpated.Gastrostomy tube in place size  Fr cm AMT MiniOne balloon button, site clean and dry Musculoskeletal: no skeletal deformities or obvious scoliosis. Has contractures**** Skin: no rashes or neurocutaneous lesions  Neurologic Exam Mental Status: awake and fully alert. Has no language.  Smiles responsively. Unable to follow instructions or participate in examination Cranial Nerves: fundoscopic exam - red reflex present.  Unable to fully visualize fundus.  Pupils equal briskly reactive to light.  Turns to localize faces and objects in the periphery. Turns to localize sounds in the periphery. Facial movements are asymmetric, has lower facial weakness with drooling.  Neck flexion and extension *** abnormal with poor head control.  Motor: truncal hypotonia.  *** spastic quadriparesis  Sensory: withdrawal x 4 Coordination: unable to adequately assess due to patient's inability to participate in  examination. Does not reach for objects. Gait and Station: unable to independently stand and bear weight. Able to stand with assistance but needs constant support. Able to take a few steps but has poor balance and needs support.  Reflexes: diminished and symmetric. Toes neutral. No clonus   Impression: Partial epilepsy with intractable epilepsy (HCC)  Spastic hemiplegia affecting left nondominant side, unspecified etiology (HCC)  Attention deficit hyperactivity disorder (ADHD), combined type  Apraxia  Abnormality of gait  Mixed obsessional thoughts and acts  Mixed receptive-expressive language disorder  Intellectual disability   Recommendations for plan of care: The patient's previous Epic records were reviewed. No recent diagnostic studies to be reviewed with the patient.  Plan until next visit: Continue medications as prescribed. Refills not needed today. Call if seizures occur or for questions or concerns Return in about 1 year (around 04/13/2024).  The medication list was reviewed and reconciled. No changes were made in the prescribed medications today. A complete medication list was provided to the patient.  No orders of the defined types were placed in this encounter.    Allergies as of 04/14/2023   No Known Allergies      Medication List        Accurate as of April 14, 2023  2:40 PM. If you have any questions, ask your nurse or doctor.          carbamazepine 100  MG 12 hr tablet Commonly known as: TEGRETOL XR TAKE 2 TABLETS BY MOUTH EVERY MORNING PLUS 1 TAB AT NOON AND 2 TABS AT BEDTIME   chlorhexidine 0.12 % solution Commonly known as: PERIDEX RINSE WITH 1/2 OZ TWICE A WEEK BEFORE BEDTIME   divalproex 500 MG DR tablet Commonly known as: DEPAKOTE TAKE 1 TABLET BY MOUTH IN THE MORNING, TAKE 1 TABLET AT MID-DAY, AND 2 TABLETS AT BEDTIME   FLUoxetine 40 MG capsule Commonly known as: PROZAC Take 40 mg by mouth daily.   Intuniv 4 MG Tb24 ER  tablet Generic drug: guanFACINE   multivitamin with minerals tablet Take 1 tablet by mouth daily.   Sodium Fluoride 5000 PPM 1.1 % Pste Generic drug: Sodium Fluoride USE THIN RIBBON OF PASTE ON TOOTHBRUSH AT BEDTIME. DO NOT RINSE      Total time spent with the patient was 20 minutes, of which 50% or more was spent in counseling and coordination of care.  Elveria Rising NP-C [facility] [address] [place], [place] [address] Ph. [phone] Fax [phone]

## 2023-04-15 ENCOUNTER — Encounter (INDEPENDENT_AMBULATORY_CARE_PROVIDER_SITE_OTHER): Payer: Self-pay | Admitting: Family

## 2023-04-27 DIAGNOSIS — F840 Autistic disorder: Secondary | ICD-10-CM | POA: Diagnosis not present

## 2023-04-27 DIAGNOSIS — F428 Other obsessive-compulsive disorder: Secondary | ICD-10-CM | POA: Diagnosis not present

## 2023-04-27 DIAGNOSIS — F900 Attention-deficit hyperactivity disorder, predominantly inattentive type: Secondary | ICD-10-CM | POA: Diagnosis not present

## 2023-04-27 DIAGNOSIS — F71 Moderate intellectual disabilities: Secondary | ICD-10-CM | POA: Diagnosis not present

## 2023-06-11 ENCOUNTER — Other Ambulatory Visit (INDEPENDENT_AMBULATORY_CARE_PROVIDER_SITE_OTHER): Payer: Self-pay | Admitting: Family

## 2023-06-11 DIAGNOSIS — G40119 Localization-related (focal) (partial) symptomatic epilepsy and epileptic syndromes with simple partial seizures, intractable, without status epilepticus: Secondary | ICD-10-CM

## 2023-06-11 DIAGNOSIS — G40109 Localization-related (focal) (partial) symptomatic epilepsy and epileptic syndromes with simple partial seizures, not intractable, without status epilepticus: Secondary | ICD-10-CM

## 2023-07-04 ENCOUNTER — Other Ambulatory Visit (INDEPENDENT_AMBULATORY_CARE_PROVIDER_SITE_OTHER): Payer: Self-pay | Admitting: Family

## 2023-07-04 DIAGNOSIS — G40409 Other generalized epilepsy and epileptic syndromes, not intractable, without status epilepticus: Secondary | ICD-10-CM

## 2023-07-05 NOTE — Telephone Encounter (Signed)
Last OV 04/14/2023 Next OV 04/13/2024 Rx written 04/06/23 90 d supply

## 2023-07-27 DIAGNOSIS — F71 Moderate intellectual disabilities: Secondary | ICD-10-CM | POA: Diagnosis not present

## 2023-07-27 DIAGNOSIS — F840 Autistic disorder: Secondary | ICD-10-CM | POA: Diagnosis not present

## 2023-07-27 DIAGNOSIS — F428 Other obsessive-compulsive disorder: Secondary | ICD-10-CM | POA: Diagnosis not present

## 2023-07-27 DIAGNOSIS — F900 Attention-deficit hyperactivity disorder, predominantly inattentive type: Secondary | ICD-10-CM | POA: Diagnosis not present

## 2023-10-18 DIAGNOSIS — F428 Other obsessive-compulsive disorder: Secondary | ICD-10-CM | POA: Diagnosis not present

## 2023-10-18 DIAGNOSIS — F71 Moderate intellectual disabilities: Secondary | ICD-10-CM | POA: Diagnosis not present

## 2023-10-18 DIAGNOSIS — F900 Attention-deficit hyperactivity disorder, predominantly inattentive type: Secondary | ICD-10-CM | POA: Diagnosis not present

## 2023-10-18 DIAGNOSIS — F840 Autistic disorder: Secondary | ICD-10-CM | POA: Diagnosis not present

## 2024-01-06 ENCOUNTER — Other Ambulatory Visit (INDEPENDENT_AMBULATORY_CARE_PROVIDER_SITE_OTHER): Payer: Self-pay | Admitting: Family

## 2024-01-06 DIAGNOSIS — G40409 Other generalized epilepsy and epileptic syndromes, not intractable, without status epilepticus: Secondary | ICD-10-CM

## 2024-01-13 DIAGNOSIS — F900 Attention-deficit hyperactivity disorder, predominantly inattentive type: Secondary | ICD-10-CM | POA: Diagnosis not present

## 2024-01-13 DIAGNOSIS — F840 Autistic disorder: Secondary | ICD-10-CM | POA: Diagnosis not present

## 2024-01-13 DIAGNOSIS — F422 Mixed obsessional thoughts and acts: Secondary | ICD-10-CM | POA: Diagnosis not present

## 2024-01-13 DIAGNOSIS — F71 Moderate intellectual disabilities: Secondary | ICD-10-CM | POA: Diagnosis not present

## 2024-01-19 ENCOUNTER — Encounter: Payer: Self-pay | Admitting: Family Medicine

## 2024-01-19 ENCOUNTER — Ambulatory Visit: Payer: BC Managed Care – PPO | Admitting: Family Medicine

## 2024-01-19 VITALS — BP 106/69 | HR 100 | Temp 97.7°F | Ht 68.8 in | Wt 216.0 lb

## 2024-01-19 DIAGNOSIS — G40119 Localization-related (focal) (partial) symptomatic epilepsy and epileptic syndromes with simple partial seizures, intractable, without status epilepticus: Secondary | ICD-10-CM | POA: Diagnosis not present

## 2024-01-19 DIAGNOSIS — E782 Mixed hyperlipidemia: Secondary | ICD-10-CM

## 2024-01-19 DIAGNOSIS — Z0001 Encounter for general adult medical examination with abnormal findings: Secondary | ICD-10-CM

## 2024-01-19 DIAGNOSIS — Z0000 Encounter for general adult medical examination without abnormal findings: Secondary | ICD-10-CM

## 2024-01-19 DIAGNOSIS — G808 Other cerebral palsy: Secondary | ICD-10-CM

## 2024-01-19 LAB — LIPID PANEL

## 2024-01-19 LAB — URINALYSIS
Bilirubin, UA: NEGATIVE
Glucose, UA: NEGATIVE
Leukocytes,UA: NEGATIVE
Nitrite, UA: NEGATIVE
Protein,UA: NEGATIVE
RBC, UA: NEGATIVE
Specific Gravity, UA: 1.025 (ref 1.005–1.030)
Urobilinogen, Ur: 2 mg/dL — ABNORMAL HIGH (ref 0.2–1.0)
pH, UA: 7 (ref 5.0–7.5)

## 2024-01-19 NOTE — Progress Notes (Signed)
 Subjective:  Patient ID: Trevor Lee, male    DOB: May 21, 1991  Age: 33 y.o. MRN: [id number]  CC: Annual Exam   HPI Trevor Lee presents for Annual physical. Mom worried about weight. Still treted for seizures. Can afford the GLP 1 meds, but worried about side effects.      01/19/2024    3:28 PM 01/18/2023    3:02 PM 12/03/2021    2:02 PM  Depression screen PHQ 2/9  Decreased Interest 0 0 0  Down, Depressed, Hopeless 0 0 0  PHQ - 2 Score 0 0 0  Altered sleeping 0  1  Tired, decreased energy 0  1  Change in appetite 1  3  Feeling bad or failure about yourself  0  0  Trouble concentrating 0  0  Moving slowly or fidgety/restless 0  0  Suicidal thoughts 0  0  PHQ-9 Score 1  5  Difficult doing work/chores Not difficult at all  Not difficult at all    History Trevor Lee has a past medical history of Aggressive behavior (06/23/2017), Autistic disorder, and Seizures (HCC).   Trevor Lee has a past surgical history that includes Tendon transfer elbow (Left, December 2013).   His family history is not on file.Trevor Lee reports that Trevor Lee has never smoked. Trevor Lee has never used smokeless tobacco. Trevor Lee reports that Trevor Lee does not drink alcohol and does not use drugs.    ROS Review of Systems  Constitutional:  Negative for activity change, fatigue, fever and unexpected weight change.  HENT:  Negative for congestion, ear pain, hearing loss, postnasal drip and trouble swallowing.   Eyes:  Negative for pain and visual disturbance.  Respiratory:  Negative for cough, chest tightness and shortness of breath.   Cardiovascular:  Negative for chest pain, palpitations and leg swelling.  Gastrointestinal:  Negative for abdominal distention, abdominal pain, blood in stool, constipation, diarrhea, nausea and vomiting.  Endocrine: Negative for cold intolerance, heat intolerance and polydipsia.  Genitourinary:  Negative for difficulty urinating, dysuria, flank pain, frequency and urgency.  Musculoskeletal:  Negative  for arthralgias and joint swelling.  Skin:  Negative for color change, rash and wound.  Neurological:  Negative for dizziness, syncope, speech difficulty, weakness, light-headedness, numbness and headaches.  Hematological:  Does not bruise/bleed easily.  Psychiatric/Behavioral:  Negative for confusion, decreased concentration, dysphoric mood and sleep disturbance. The patient is not nervous/anxious.     Objective:  BP 106/69   Pulse 100   Temp 97.7 F (36.5 C)   Ht 5' 8.8" (1.748 m)   Wt 216 lb (98 kg)   SpO2 95%   BMI 32.08 kg/m   BP Readings from Last 3 Encounters:  01/19/24 106/69  04/14/23 118/76  01/18/23 121/78    Wt Readings from Last 3 Encounters:  01/19/24 216 lb (98 kg)  04/14/23 210 lb 6.4 oz (95.4 kg)  01/18/23 211 lb (95.7 kg)     Physical Exam Constitutional:      Appearance: Trevor Lee is well-developed.  HENT:     Head: Normocephalic and atraumatic.  Eyes:     Pupils: Pupils are equal, round, and reactive to light.  Neck:     Thyroid: No thyromegaly.     Trachea: No tracheal deviation.  Cardiovascular:     Rate and Rhythm: Normal rate and regular rhythm.     Heart sounds: Normal heart sounds. No murmur heard.    No friction rub. No gallop.  Pulmonary:     Breath sounds: Normal breath sounds.  No wheezing or rales.  Abdominal:     General: Bowel sounds are normal. There is no distension.     Palpations: Abdomen is soft. There is no mass.     Tenderness: There is no abdominal tenderness.     Hernia: There is no hernia in the left inguinal area.  Musculoskeletal:        General: Normal range of motion.     Cervical back: Normal range of motion.  Lymphadenopathy:     Cervical: No cervical adenopathy.  Skin:    General: Skin is warm and dry.  Neurological:     Mental Status: Trevor Lee is alert and oriented to person, place, and time.      Assessment & Plan:  Mixed hyperlipidemia -     Lipid panel  Well adult exam -     CBC with Differential/Platelet -      CMP14+EGFR -     Lipid panel -     Urinalysis  Congenital hemiplegia (HCC) -     CBC with Differential/Platelet -     CMP14+EGFR  Partial epilepsy with intractable epilepsy (HCC) -     CBC with Differential/Platelet -     CMP14+EGFR     Follow-up: No follow-ups on file.  Roise Cleaver, M.D.

## 2024-01-20 LAB — CBC WITH DIFFERENTIAL/PLATELET
Basophils Absolute: 0.1 10*3/uL (ref 0.0–0.2)
Basos: 1 %
EOS (ABSOLUTE): 0.3 10*3/uL (ref 0.0–0.4)
Eos: 4 %
Hematocrit: 46 % (ref 37.5–51.0)
Hemoglobin: 15.6 g/dL (ref 13.0–17.7)
Immature Grans (Abs): 0.1 10*3/uL (ref 0.0–0.1)
Immature Granulocytes: 1 %
Lymphocytes Absolute: 4.7 10*3/uL — ABNORMAL HIGH (ref 0.7–3.1)
Lymphs: 54 %
MCH: 32.7 pg (ref 26.6–33.0)
MCHC: 33.9 g/dL (ref 31.5–35.7)
MCV: 96 fL (ref 79–97)
Monocytes Absolute: 0.8 10*3/uL (ref 0.1–0.9)
Monocytes: 9 %
Neutrophils Absolute: 2.6 10*3/uL (ref 1.4–7.0)
Neutrophils: 31 %
Platelets: 274 10*3/uL (ref 150–450)
RBC: 4.77 x10E6/uL (ref 4.14–5.80)
RDW: 12.4 % (ref 11.6–15.4)
WBC: 8.6 10*3/uL (ref 3.4–10.8)

## 2024-01-20 LAB — CMP14+EGFR
ALT: 11 IU/L (ref 0–44)
AST: 13 IU/L (ref 0–40)
Albumin: 4.4 g/dL (ref 4.3–5.2)
Alkaline Phosphatase: 61 IU/L (ref 44–121)
BUN/Creatinine Ratio: 27 — ABNORMAL HIGH (ref 9–20)
BUN: 18 mg/dL (ref 6–20)
CO2: 24 mmol/L (ref 20–29)
Calcium: 9.5 mg/dL (ref 8.7–10.2)
Chloride: 102 mmol/L (ref 96–106)
Creatinine, Ser: 0.67 mg/dL — ABNORMAL LOW (ref 0.76–1.27)
Globulin, Total: 2.3 g/dL (ref 1.5–4.5)
Glucose: 85 mg/dL (ref 70–99)
Potassium: 4.5 mmol/L (ref 3.5–5.2)
Sodium: 140 mmol/L (ref 134–144)
Total Protein: 6.7 g/dL (ref 6.0–8.5)
eGFR: 129 mL/min/{1.73_m2} (ref >59)

## 2024-01-20 LAB — LIPID PANEL
Cholesterol, Total: 156 mg/dL (ref 100–199)
HDL: 61 mg/dL (ref >39)
LDL CALC COMMENT:: 2.6 ratio (ref 0.0–5.0)
LDL Chol Calc (NIH): 74 mg/dL (ref 0–99)
Triglycerides: 116 mg/dL (ref 0–149)
VLDL Cholesterol Cal: 21 mg/dL (ref 5–40)

## 2024-01-23 ENCOUNTER — Ambulatory Visit: Payer: Self-pay | Admitting: Family Medicine

## 2024-01-23 NOTE — Progress Notes (Signed)
Hello Nole,  Your lab result is normal and/or stable.Some minor variations that are not significant are commonly marked abnormal, but do not represent any medical problem for you.  Best regards, Emmanuel Gruenhagen, M.D.

## 2024-04-02 ENCOUNTER — Other Ambulatory Visit (INDEPENDENT_AMBULATORY_CARE_PROVIDER_SITE_OTHER): Payer: Self-pay | Admitting: Family

## 2024-04-02 DIAGNOSIS — G40409 Other generalized epilepsy and epileptic syndromes, not intractable, without status epilepticus: Secondary | ICD-10-CM

## 2024-04-13 ENCOUNTER — Ambulatory Visit (INDEPENDENT_AMBULATORY_CARE_PROVIDER_SITE_OTHER): Payer: BC Managed Care – PPO | Admitting: Family

## 2024-04-13 DIAGNOSIS — F900 Attention-deficit hyperactivity disorder, predominantly inattentive type: Secondary | ICD-10-CM | POA: Diagnosis not present

## 2024-04-13 DIAGNOSIS — F840 Autistic disorder: Secondary | ICD-10-CM | POA: Diagnosis not present

## 2024-04-13 DIAGNOSIS — F71 Moderate intellectual disabilities: Secondary | ICD-10-CM | POA: Diagnosis not present

## 2024-04-13 DIAGNOSIS — F422 Mixed obsessional thoughts and acts: Secondary | ICD-10-CM | POA: Diagnosis not present

## 2024-04-20 ENCOUNTER — Encounter (INDEPENDENT_AMBULATORY_CARE_PROVIDER_SITE_OTHER): Payer: Self-pay | Admitting: Family

## 2024-04-20 ENCOUNTER — Ambulatory Visit (INDEPENDENT_AMBULATORY_CARE_PROVIDER_SITE_OTHER): Admitting: Family

## 2024-04-20 VITALS — BP 124/82 | HR 80 | Ht 67.0 in | Wt 208.8 lb

## 2024-04-20 DIAGNOSIS — G40409 Other generalized epilepsy and epileptic syndromes, not intractable, without status epilepticus: Secondary | ICD-10-CM | POA: Diagnosis not present

## 2024-04-20 DIAGNOSIS — R482 Apraxia: Secondary | ICD-10-CM

## 2024-04-20 DIAGNOSIS — F902 Attention-deficit hyperactivity disorder, combined type: Secondary | ICD-10-CM

## 2024-04-20 DIAGNOSIS — G40119 Localization-related (focal) (partial) symptomatic epilepsy and epileptic syndromes with simple partial seizures, intractable, without status epilepticus: Secondary | ICD-10-CM

## 2024-04-20 DIAGNOSIS — G40109 Localization-related (focal) (partial) symptomatic epilepsy and epileptic syndromes with simple partial seizures, not intractable, without status epilepticus: Secondary | ICD-10-CM

## 2024-04-20 DIAGNOSIS — R269 Unspecified abnormalities of gait and mobility: Secondary | ICD-10-CM

## 2024-04-20 DIAGNOSIS — G8114 Spastic hemiplegia affecting left nondominant side: Secondary | ICD-10-CM | POA: Diagnosis not present

## 2024-04-20 DIAGNOSIS — F79 Unspecified intellectual disabilities: Secondary | ICD-10-CM

## 2024-04-20 MED ORDER — DIVALPROEX SODIUM 500 MG PO DR TAB: DELAYED_RELEASE_TABLET | ORAL | 3 refills | Status: AC

## 2024-04-20 MED ORDER — CARBAMAZEPINE ER 100 MG PO TB12
ORAL_TABLET | ORAL | 3 refills | Status: AC
Start: 2024-04-20 — End: ?

## 2024-04-20 NOTE — Patient Instructions (Signed)
 It was a pleasure to see you today!  Instructions for you until your next appointment are as follows: Continue taking your medications as prescribed Let me know if any seizures occur Please sign up for MyChart if you have not done so. Please plan to return for follow up in 1 year or sooner if needed.  Feel free to contact our office during normal business hours at [phone] with questions or concerns. If there is no answer or the call is outside business hours, please leave a message and our clinic staff will call you back within the next business day.  If you have an urgent concern, please stay on the line for our after-hours answering service and ask for the on-call neurologist.     I also encourage you to use MyChart to communicate with me more directly. If you have not yet signed up for MyChart within [facility], the front desk staff can help you. However, please note that this inbox is NOT monitored on nights or weekends, and response can take up to 2 business days.  Urgent matters should be discussed with the on-call pediatric neurologist.   At [facility], we are committed to providing exceptional care. You will receive a patient satisfaction survey through text or email regarding your visit today. Your opinion is important to me. Comments are appreciated.

## 2024-04-20 NOTE — Progress Notes (Signed)
 Trevor Lee   MRN:  [id number]  26-Feb-1991   Provider: Ellouise Bollman NP-C Location of Care: [facility]  Visit type: Return visit  Last visit: 04/14/2023  Referral source: Zollie Lowers, MD History from: Epic chart and patient's mother  Brief history:  Copied from previous record: History of congential left hemiparesis, left hemiatrophy, partial seizures of right brain signature, 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 Trevor Lee is taking and tolerating Carbamazpine ER and generic Divalproex  for his seizure disorder and has remained seizure free since September 2016.    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 Trevor Lee has had problems with his mood and with compulsive behaviors in the past, but has had problems with tolerance.   Today's concerns: He has remained seizure free since his last visit Attends a day program and is looking forward to upcoming activities Behavior is not generally problematic Trevor Lee has been otherwise generally healthy since he was last seen. No health concerns today other than previously mentioned.  Review of systems: Please see HPI for neurologic and other pertinent review of systems. Otherwise all other systems were reviewed and were negative.  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   Social history: Social History   Socioeconomic History   Marital status: Single    Spouse name: Not on file   Number of children: Not on file   Years of education: Not on file   Highest education level: Not on file  Occupational History   Not on file  Tobacco Use   Smoking status: Never   Smokeless tobacco: Never  Vaping Use   Vaping status: Never Used  Substance and Sexual Activity   Alcohol use: No   Drug use: No   Sexual activity: Never  Other Topics Concern   Not on file  Social History Narrative   ** Merged History Encounter ** He graduated from [employer] where he was in a self contained class setting. He has had difficulty with attention span, and difficulty keeping up with the pace of regular classes. Trevor Lee is now in enrolled in a day program 5 days per week. He enjoys riding his bike, [employer] basketball, listening to music, computer and playing music. He is employed at the [employer] where he does [employer].      Lives with mom, and maternal grandparents lives downstairs   1  dog   Social Drivers of Corporate investment banker Strain: Not on file  Food Insecurity: Not on file  Transportation Needs: Not on file  Physical Activity: Not on file  Stress: Not on file  Social Connections: Not on file  Intimate Partner Violence: Not on file    Past/failed meds: Copied from previous  record: Abilify  - weight gain Vyvanse  - tachycardia CBD oil - no improvement in behavior  Allergies: No Known Allergies   Immunizations: Immunization History  Administered Date(s) Administered   DTaP 04/25/1993, 06/26/1993, 08/26/1993, 06/08/1994, 03/25/1998   HIB (PRP-OMP) 04/25/1993, 06/26/1993, 08/26/1993, 06/08/1994   Hepatitis A 12/09/2005, 03/03/2007   Hepatitis B 1993-07-17, 04/25/1993, 08/26/1993   IPV 04/25/1993, 06/26/1993, 08/26/1993, 04/18/1998   Influenza Inj Mdck Quad Pf 06/07/2018   Influenza,inj,Quad PF,6+ Mos 07/11/2015, 06/10/2017, 05/15/2020   Influenza-Unspecified 06/07/2018   MMR 06/08/1994, 03/25/1998   Meningococcal Conjugate 12/09/2005   Td 10/09/2003, 07/11/2015   Tdap 07/11/2015   Varicella 05/04/1995    Diagnostics/Screenings:  Physical Exam: BP 124/82   Pulse 80   Ht 5' 7 (1.702 m)   Wt 208 lb 12.8 oz (94.7 kg)   BMI 32.70 kg/m   General: well developed, well nourished man, seated on exam table, in no evident distress Head: normocephalic and atraumatic. Oropharynx difficult to examine but appears benign. No dysmorphic features. Neck: supple Cardiovascular: regular rate and rhythm, no murmurs. Respiratory: clear to auscultation bilaterally Abdomen: bowel sounds present all four quadrants, abdomen soft, non-tender, non-distended.  Musculoskeletal: no skeletal deformities or obvious scoliosis. Has contractures in the left arm and leg with left wrist drop, clawhand deformity and decreased range of motion at the elbow. Wears left AFO Skin: no rashes or neurocutaneous lesions  Neurologic Exam Mental Status: awake and fully alert. Attention span and fund of knowledge subnormal for age. Speech with mild dysarthria. Impulsive behavior.  Cranial Nerves: fundoscopic exam - red reflex present.  Unable to fully visualize fundus.  Pupils equal briskly reactive to light.  Turns to localize faces and objects in the periphery. Turns to localize sounds in the  periphery. Facial movements are symmetric Motor: normal on the right. Increased tone on the left Sensory: withdrawal x 4 Coordination: unable to adequately assess due to patient's inability to participate in examination. No dysmetria with reach for objects on the right. 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 No recent diagnostic studies to be reviewed with the patient.  Plan until next visit: Continue medications as prescribed  Call for questions or concerns Return in about 1 year (around 04/20/2025).  The medication list was reviewed and reconciled. No changes were made in the prescribed medications today. A complete medication list was provided to the patient.  Allergies as of 04/20/2024   No Known Allergies      Medication List        Accurate as of April 20, 2024 11:59 PM. If you have any  questions, ask your nurse or doctor.          carbamazepine  100 MG 12 hr tablet Commonly known as: TEGRETOL  XR TAKE 2 TABLETS BY MOUTH EVERY MORNING PLUS 1 TAB AT NOON AND 2 TABS AT BEDTIME   chlorhexidine 0.12 % solution Commonly known as: PERIDEX RINSE WITH 1/2 OZ TWICE A WEEK BEFORE BEDTIME   divalproex  500 MG DR tablet Commonly known as: DEPAKOTE  TAKE 1 TABLET BY MOUTH IN THE MORNING, TAKE 1 TABLET AT  MID-DAY, AND 2 TABLETS AT BEDTIME   FLUoxetine 40 MG capsule Commonly known as: PROZAC Take 40 mg by mouth daily.   Intuniv 4 MG Tb24 ER tablet Generic drug: guanFACINE   multivitamin with minerals tablet Take 1 tablet by mouth daily.   Sodium Fluoride 5000 PPM 1.1 % Pste Generic drug: Sodium Fluoride USE THIN RIBBON OF PASTE ON TOOTHBRUSH AT BEDTIME. DO NOT RINSE      Total time spent with the patient was 25 minutes, of which 50% or more was spent in counseling and coordination of care.  Ellouise Bollman NP-C [facility] [address] [place], [place] [address] Ph. [phone] Fax [phone]

## 2024-04-23 ENCOUNTER — Encounter (INDEPENDENT_AMBULATORY_CARE_PROVIDER_SITE_OTHER): Payer: Self-pay | Admitting: Family

## 2024-07-10 DIAGNOSIS — F900 Attention-deficit hyperactivity disorder, predominantly inattentive type: Secondary | ICD-10-CM | POA: Diagnosis not present

## 2024-07-10 DIAGNOSIS — F840 Autistic disorder: Secondary | ICD-10-CM | POA: Diagnosis not present

## 2024-07-10 DIAGNOSIS — F71 Moderate intellectual disabilities: Secondary | ICD-10-CM | POA: Diagnosis not present

## 2024-07-10 DIAGNOSIS — F422 Mixed obsessional thoughts and acts: Secondary | ICD-10-CM | POA: Diagnosis not present

## 2024-09-14 ENCOUNTER — Encounter (INDEPENDENT_AMBULATORY_CARE_PROVIDER_SITE_OTHER): Payer: Self-pay | Admitting: Family

## 2024-09-14 ENCOUNTER — Telehealth (INDEPENDENT_AMBULATORY_CARE_PROVIDER_SITE_OTHER): Payer: Self-pay | Admitting: Family

## 2024-09-14 NOTE — Telephone Encounter (Signed)
 carbamazepine  (TEGRETOL  XR) 100 MG 12 hr tablet  divalproex  (DEPAKOTE ) 500 MG DR tablet   Mom came to the office requesting a new form be completed for school so that he can take his medicine at school.   Please upload the letter to [facility] and mom will print.   Any questions please call [phone]

## 2025-01-22 ENCOUNTER — Encounter: Payer: Self-pay | Admitting: Family Medicine

## 2025-04-25 ENCOUNTER — Ambulatory Visit (INDEPENDENT_AMBULATORY_CARE_PROVIDER_SITE_OTHER): Admitting: Family
